# Patient Record
Sex: Male | Born: 1955 | Race: White | Hispanic: No | Smoking: Current every day smoker
Health system: Southern US, Community
[De-identification: ages and names within clinical notes are randomized; demographics above are authoritative.]

## PROBLEM LIST (undated history)

## (undated) DIAGNOSIS — E538 Deficiency of other specified B group vitamins: Secondary | ICD-10-CM

## (undated) DIAGNOSIS — L309 Dermatitis, unspecified: Secondary | ICD-10-CM

## (undated) DIAGNOSIS — D649 Anemia, unspecified: Secondary | ICD-10-CM

## (undated) DIAGNOSIS — I10 Essential (primary) hypertension: Secondary | ICD-10-CM

## (undated) DIAGNOSIS — K5190 Ulcerative colitis, unspecified, without complications: Secondary | ICD-10-CM

---

## 2004-08-14 ENCOUNTER — Inpatient Hospital Stay (HOSPITAL_COMMUNITY): Admission: EM | Admit: 2004-08-14 | Discharge: 2004-08-16 | Payer: Self-pay | Admitting: Emergency Medicine

## 2012-05-21 ENCOUNTER — Emergency Department (HOSPITAL_COMMUNITY): Payer: Medicare Other

## 2012-05-21 ENCOUNTER — Encounter (HOSPITAL_COMMUNITY): Payer: Self-pay | Admitting: *Deleted

## 2012-05-21 ENCOUNTER — Emergency Department (HOSPITAL_COMMUNITY)
Admission: EM | Admit: 2012-05-21 | Discharge: 2012-05-21 | Disposition: A | Discharge status: Home / Self Care | Payer: Medicare Other | Attending: Emergency Medicine | Admitting: Emergency Medicine

## 2012-05-21 DIAGNOSIS — L03221 Cellulitis of neck: Secondary | ICD-10-CM

## 2012-05-21 DIAGNOSIS — L0201 Cutaneous abscess of face: Secondary | ICD-10-CM | POA: Insufficient documentation

## 2012-05-21 DIAGNOSIS — R221 Localized swelling, mass and lump, neck: Secondary | ICD-10-CM | POA: Insufficient documentation

## 2012-05-21 DIAGNOSIS — R220 Localized swelling, mass and lump, head: Secondary | ICD-10-CM | POA: Insufficient documentation

## 2012-05-21 DIAGNOSIS — L03211 Cellulitis of face: Secondary | ICD-10-CM | POA: Insufficient documentation

## 2012-05-21 HISTORY — DX: Ulcerative colitis, unspecified, without complications: K51.90

## 2012-05-21 HISTORY — DX: Anemia, unspecified: D64.9

## 2012-05-21 LAB — BASIC METABOLIC PANEL
BUN: 6 mg/dL (ref 6–23)
CO2: 25 mEq/L (ref 19–32)
Calcium: 9.4 mg/dL (ref 8.4–10.5)
Chloride: 97 mEq/L (ref 96–112)
Creatinine, Ser: 0.85 mg/dL (ref 0.50–1.35)
GFR calc non Af Amer: >90 mL/min (ref >90)
Glucose, Bld: 96 mg/dL (ref 70–99)
Potassium: 3.8 mEq/L (ref 3.5–5.1)

## 2012-05-21 LAB — CBC WITH DIFFERENTIAL/PLATELET
Basophils Absolute: 0.1 10*3/uL (ref 0.0–0.1)
Basophils Relative: 0 % (ref 0–1)
Eosinophils Absolute: 0.1 10*3/uL (ref 0.0–0.7)
Eosinophils Relative: 1 % (ref 0–5)
HCT: 38.8 % — ABNORMAL LOW (ref 39.0–52.0)
Hemoglobin: 13 g/dL (ref 13.0–17.0)
Lymphocytes Relative: 5 % — ABNORMAL LOW (ref 12–46)
Lymphs Abs: 0.8 10*3/uL (ref 0.7–4.0)
MCH: 27.7 pg (ref 26.0–34.0)
MCV: 82.6 fL (ref 78.0–100.0)
Monocytes Relative: 13 % — ABNORMAL HIGH (ref 3–12)
Neutrophils Relative %: 81 % — ABNORMAL HIGH (ref 43–77)
RBC: 4.7 MIL/uL (ref 4.22–5.81)
RDW: 18.7 % — ABNORMAL HIGH (ref 11.5–15.5)

## 2012-05-21 MED ORDER — MELOXICAM 7.5 MG PO TABS: ORAL_TABLET | ORAL | Status: DC

## 2012-05-21 MED ORDER — CIPROFLOXACIN HCL 500 MG PO TABS: 500.0000 mg | ORAL_TABLET | Freq: Two times a day (BID) | ORAL | Status: DC

## 2012-05-21 MED ORDER — DEXTROSE 5 % IV SOLN
2.0000 g | Freq: Once | INTRAVENOUS | Status: AC
  Administered 2012-05-21: 2 g via INTRAVENOUS
  Filled 2012-05-21: qty 2

## 2012-05-21 MED ORDER — SODIUM CHLORIDE 0.9 % IV SOLN
Freq: Once | INTRAVENOUS | Status: AC
  Administered 2012-05-21: 13:00:00 via INTRAVENOUS

## 2012-05-21 MED ORDER — AMOXICILLIN-POT CLAVULANATE 875-125 MG PO TABS: 1.0000 | ORAL_TABLET | Freq: Two times a day (BID) | ORAL | Status: DC

## 2012-05-21 MED ORDER — OXYCODONE-ACETAMINOPHEN 5-325 MG PO TABS: ORAL_TABLET | ORAL | Status: DC

## 2012-05-21 MED ORDER — HYDROMORPHONE HCL PF 1 MG/ML IJ SOLN
0.5000 mg | Freq: Once | INTRAMUSCULAR | Status: AC
  Administered 2012-05-21: 0.5 mg via INTRAVENOUS
  Filled 2012-05-21: qty 1

## 2012-05-21 MED ORDER — CIPROFLOXACIN IN D5W 400 MG/200ML IV SOLN
400.0000 mg | Freq: Once | INTRAVENOUS | Status: AC
  Administered 2012-05-21: 400 mg via INTRAVENOUS
  Filled 2012-05-21: qty 200

## 2012-05-21 MED ORDER — OXYCODONE-ACETAMINOPHEN 5-325 MG PO TABS
1.0000 | ORAL_TABLET | Freq: Once | ORAL | Status: AC
  Administered 2012-05-21: 1 via ORAL
  Filled 2012-05-21: qty 1

## 2012-05-21 MED ORDER — ONDANSETRON HCL 4 MG/2ML IJ SOLN
4.0000 mg | Freq: Once | INTRAMUSCULAR | Status: AC
  Administered 2012-05-21: 4 mg via INTRAVENOUS
  Filled 2012-05-21: qty 2

## 2012-05-21 MED ORDER — IOHEXOL 300 MG/ML  SOLN
80.0000 mL | Freq: Once | INTRAMUSCULAR | Status: AC | PRN
  Administered 2012-05-21: 80 mL via INTRAVENOUS

## 2012-05-21 MED ORDER — KETOROLAC TROMETHAMINE 10 MG PO TABS
10.0000 mg | ORAL_TABLET | Freq: Once | ORAL | Status: AC
  Administered 2012-05-21: 10 mg via ORAL
  Filled 2012-05-21: qty 1

## 2012-05-21 NOTE — ED Provider Notes (Signed)
History     CSN: [id number]  Arrival date & time 05/21/12  1057   First MD Initiated Contact with Patient 05/21/12 1212      Chief Complaint  Patient presents with  . Facial Swelling    (Consider location/radiation/quality/duration/timing/severity/associated sxs/prior treatment) HPI Comments: Patient states that he has allergies and a lot of itching involving his scalp. He sometimes scratches to be called" draws blood" and even after the area scabs, he sometimes scratches the scab off. The patient states that he swims a lot in lakes or ponds and pools quite frequently. The patient presents to the emergency department today with 3 days of increased redness, and swelling with severe pain involving the left scalp the left year and a times the left neck. The patient denies fever or chills. States he has not been a well-defined comfortable position because of the pain. He has not had any drainage from the ear. He is at times had a problem hearing out of the ear, but states he frequently has sinus related issues as well. He denies any foreign bodies placed in the ear. He has tried Tylenol and Xanax but these are not helping.  The history is provided by the patient.    Past Medical History  Diagnosis Date  . Ulcerative colitis   . Anemia     History reviewed. No pertinent past surgical history.  No family history on file.  History  Substance Use Topics  . Smoking status: Current Every Day Smoker  . Smokeless tobacco: Not on file  . Alcohol Use: No      Review of Systems  Constitutional: Negative for activity change.       All ROS Neg except as noted in HPI  HENT: Positive for ear pain and neck pain. Negative for nosebleeds.   Eyes: Negative for photophobia and discharge.  Respiratory: Positive for wheezing. Negative for cough and shortness of breath.   Cardiovascular: Negative for chest pain and palpitations.  Gastrointestinal: Positive for abdominal pain. Negative for blood in  stool.  Genitourinary: Negative for dysuria, frequency and hematuria.  Musculoskeletal: Negative for back pain and arthralgias.  Skin: Negative.   Neurological: Negative for dizziness, seizures and speech difficulty.  Psychiatric/Behavioral: Negative for hallucinations and confusion. The patient is nervous/anxious.     Allergies  Review of patient's allergies indicates no known allergies.  Home Medications   Current Outpatient Rx  Name Route Sig Dispense Refill  . ACETAMINOPHEN 500 MG PO TABS Oral Take 1,000 mg by mouth every 6 (six) hours as needed. Pain    . ALPRAZOLAM 1 MG PO TABS Oral Take 1 mg by mouth 3 (three) times daily.    Marland Kitchen HYDROXYZINE HCL 25 MG PO TABS Oral Take 25 mg by mouth 3 (three) times daily as needed. Itching    . LOSARTAN POTASSIUM 100 MG PO TABS Oral Take 100 mg by mouth daily.    . ADULT MULTIVITAMIN W/MINERALS CH Oral Take 1 tablet by mouth daily.    . SULFASALAZINE 500 MG PO TABS Oral Take 1,000 mg by mouth 3 (three) times daily.      BP 143/89  Pulse 78  Temp 98.4 F (36.9 C)  Resp 16  Ht 5\' 10"  (1.778 m)  Wt 175 lb (79.379 kg)  BMI 25.11 kg/m2  SpO2 97%  Physical Exam  Nursing note and vitals reviewed. Constitutional: He is oriented to person, place, and time. He appears well-developed and well-nourished.  Non-toxic appearance.  HENT:  Head: Normocephalic.  Right Ear: Tympanic membrane and external ear normal.  Left Ear: Tympanic membrane and external ear normal.       There is increased redness and mild to moderate swelling of the left scalp. There is a scabbed lesion above the left year the scalp area. There is increased redness of the pinna of the left year. There is pain to movement of the left year. There is no drainage from the left ear. The tympanic membrane is well within normal limits. The bony landmarks identified.  Eyes: EOM and lids are normal. Pupils are equal, round, and reactive to light.  Neck: Neck supple. Carotid bruit is not  present.       There is soreness to palpation with a few palpable nodes on the left. There is pain with attempted range of motion of the neck. No rigidity appreciated. Trachea is in the midline.  Cardiovascular: Regular rhythm, normal heart sounds, intact distal pulses and normal pulses.  Tachycardia present.   Pulmonary/Chest: Breath sounds normal. No respiratory distress.  Abdominal: Soft. Bowel sounds are normal. There is no tenderness. There is no guarding.  Musculoskeletal: Normal range of motion.  Lymphadenopathy:       Head (right side): No submandibular adenopathy present.       Head (left side): No submandibular adenopathy present.    He has no cervical adenopathy.  Neurological: He is alert and oriented to person, place, and time. He has normal strength. No cranial nerve deficit or sensory deficit.  Skin: Skin is warm and dry.  Psychiatric: His speech is normal. His mood appears anxious.    ED Course  Procedures (including critical care time)  Labs Reviewed  CBC WITH DIFFERENTIAL - Abnormal; Notable for the following:    WBC 15.9 (*)     HCT 38.8 (*)     RDW 18.7 (*)     Neutrophils Relative 81 (*)     Neutro Abs 12.9 (*)     Lymphocytes Relative 5 (*)     Monocytes Relative 13 (*)     Monocytes Absolute 2.0 (*)     All other components within normal limits  BASIC METABOLIC PANEL - Abnormal; Notable for the following:    Sodium 134 (*)     All other components within normal limits   Ct Maxillofacial W/cm  05/21/2012  *RADIOLOGY REPORT*  Clinical Data: 56 year old male left facial swelling and left mastoid area pain.  CT MAXILLOFACIAL WITH CONTRAST  Technique:  Multidetector CT imaging of the maxillofacial structures was performed with intravenous contrast. Multiplanar CT image reconstructions were also generated.  Contrast: 80mL OMNIPAQUE IOHEXOL 300 MG/ML  SOLN  Comparison: cervical spine CT 08/14/2004.  Findings: Visualized brain parenchyma is within normal limits.  Visualized orbit soft tissues are within normal limits.  The paranasal sinuses are clear except for inferior right maxillary sinus mucous retention cyst.  The right tympanic cavity and mastoids are clear.  The visualized deep soft tissue spaces of the face are within normal limits.  The left tympanic cavity and mastoids are clear.  There is stranding of the superficial soft tissues and skin thickening which begins in the left superior scalp convexity and continues caudally through the periauricular area, adjacent to the left parotid gland and along the left sternocleidomastoid muscle.  By the level of the thyroid cartilage, the inflammatory changes abate. There is associated diffuse soft tissue swelling of the left pinna.  There is  heterogeneous enhancement of the left sternocleidomastoid muscle.  Left parotid gland  enhancement appears more normal  There are asymmetric left posterior lymph nodes, increased in number but nonpathologically enlarged.  .  Visualized major vascular structures are patent including the left external jugular vein and the nondominant left internal jugular vein.  No organized or drainable fluid or fluid collection identified.  Negative visualized upper cervical spine except for degenerative changes which are greater on the left.  IMPRESSION: 1.  Left lateral scalp, face, and upper neck inflammation compatible with acute infectious cellulitis/myositis.  Involvement of the left sternocleidomastoid muscle, but no middle ear/mastoid involvement.  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 Recommend prompt institution of appropriate antibiotic therapy. 2.  Reactive left cervical lymph nodes. No abscess or drainable fluid collection.  These results will be called to the ordering clinician or representative by the Radiologist Assistant, and communication documented in the PACS Dashboard.   Original Report Authenticated By:  Harley Hallmark, M.D.      1. Cellulitis of multiple sites of scalp and neck       MDM  I have reviewed nursing notes, vital signs, and all appropriate lab and imaging results for this patient. Patient has a lot of problems with itching in the scalp, he states that he may have scratched a sore in his scalp he now has a scab in the left scalp area. He also swims in lakes and ponds frequently. The patient had a CT scan with contrast of the maxillofacial area which revealed left lateral scalp face and upper neck inflammation compatible with an acute infectious process of cellulitis or myositis. There is involvement of the left sternocleidomastoid muscle but no middle ear, mass or mastoid involvement. In light of the diffuse left pinna swelling a Pseudomonas infection was favored. The basic metabolic panel shows the sodium to be slightly low at 134 otherwise well within normal limits. The complete blood count showed a white blood cell count elevated at 15,900, the hemoglobin is slightly low at 38.8 otherwise within normal limits. The differential shows the neutrophils to be slightly elevated at 81 otherwise essentially within normal limits. The patient was seen with me by Dr. Preston Fleeting. The case was discussed with the ear nose and throat specialist Dr. Suszanne Conners. He is in agreement with the IV Cipro that was given in the emergency department, and suggests oral Cipro for the next 14 days along with Augmentin. He will see the patient in his office on Thursday (October 10) unless complications arise before then. Prescription for Percocet and Mobic also given.       Kathie Dike, [place] 05/21/12 [phone]

## 2012-05-21 NOTE — ED Notes (Signed)
Patient ambulatory to restroom with steady gait.

## 2012-05-21 NOTE — ED Provider Notes (Addendum)
56 year old male has had progressive swelling and pain in the left side of his scalp and around his ear. On exam, there is edema of the skin erythema and tenderness which extends from the preauricular area to the postauricular area and into the neck. There does not seem to be any vomit the external auditory canal, but parts of the external ear are infected. CT is worrisome for malignant otitis externa, but he clinically does not appear to have that. He will be started on IV antibiotics and will need to be admitted for further IV antibiotics and consideration for evaluation by ENT.he does not appear toxic at this time, and I feel that he can safely be admitted to [facility].   Dione Booze, MD 05/21/12 1458  Medical screening examination/treatment/procedure(s) were conducted as a shared visit with non-physician practitioner(s) and myself.  I personally evaluated the patient during the encounter   Dione Booze, MD 05/21/12 1459

## 2012-05-21 NOTE — ED Notes (Signed)
Patient with no complaints at this time. Respirations even and unlabored. Skin warm/dry. Discharge instructions reviewed with patient at this time. Patient given opportunity to voice concerns/ask questions. IV removed per policy and band-aid applied to site. Patient discharged at this time and left Emergency Department with steady gait.  

## 2012-05-21 NOTE — ED Notes (Signed)
Redness and swelling with severe pain to left scalp/face. Ear pain

## 2012-05-22 ENCOUNTER — Ambulatory Visit (INDEPENDENT_AMBULATORY_CARE_PROVIDER_SITE_OTHER): Payer: Medicare Other | Admitting: Otolaryngology

## 2012-05-22 DIAGNOSIS — L0201 Cutaneous abscess of face: Secondary | ICD-10-CM

## 2012-05-29 ENCOUNTER — Ambulatory Visit (INDEPENDENT_AMBULATORY_CARE_PROVIDER_SITE_OTHER): Payer: Medicare Other | Admitting: Otolaryngology

## 2014-11-10 ENCOUNTER — Emergency Department (HOSPITAL_COMMUNITY): Payer: Medicare HMO

## 2014-11-10 ENCOUNTER — Emergency Department (HOSPITAL_COMMUNITY)
Admission: EM | Admit: 2014-11-10 | Discharge: 2014-11-10 | Disposition: A | Discharge status: Home / Self Care | Payer: Medicare HMO | Attending: Emergency Medicine | Admitting: Emergency Medicine

## 2014-11-10 ENCOUNTER — Encounter (HOSPITAL_COMMUNITY): Payer: Self-pay | Admitting: *Deleted

## 2014-11-10 DIAGNOSIS — E876 Hypokalemia: Secondary | ICD-10-CM | POA: Insufficient documentation

## 2014-11-10 DIAGNOSIS — Z7952 Long term (current) use of systemic steroids: Secondary | ICD-10-CM | POA: Diagnosis not present

## 2014-11-10 DIAGNOSIS — Z720 Tobacco use: Secondary | ICD-10-CM | POA: Diagnosis not present

## 2014-11-10 DIAGNOSIS — R109 Unspecified abdominal pain: Secondary | ICD-10-CM

## 2014-11-10 DIAGNOSIS — Z872 Personal history of diseases of the skin and subcutaneous tissue: Secondary | ICD-10-CM | POA: Diagnosis not present

## 2014-11-10 DIAGNOSIS — Z862 Personal history of diseases of the blood and blood-forming organs and certain disorders involving the immune mechanism: Secondary | ICD-10-CM | POA: Insufficient documentation

## 2014-11-10 DIAGNOSIS — Z792 Long term (current) use of antibiotics: Secondary | ICD-10-CM | POA: Insufficient documentation

## 2014-11-10 DIAGNOSIS — R1033 Periumbilical pain: Secondary | ICD-10-CM | POA: Diagnosis present

## 2014-11-10 DIAGNOSIS — I10 Essential (primary) hypertension: Secondary | ICD-10-CM | POA: Diagnosis not present

## 2014-11-10 DIAGNOSIS — Z79899 Other long term (current) drug therapy: Secondary | ICD-10-CM | POA: Diagnosis not present

## 2014-11-10 DIAGNOSIS — K529 Noninfective gastroenteritis and colitis, unspecified: Secondary | ICD-10-CM | POA: Diagnosis not present

## 2014-11-10 HISTORY — DX: Dermatitis, unspecified: L30.9

## 2014-11-10 HISTORY — DX: Deficiency of other specified B group vitamins: E53.8

## 2014-11-10 HISTORY — DX: Essential (primary) hypertension: I10

## 2014-11-10 LAB — COMPREHENSIVE METABOLIC PANEL
ALBUMIN: 4.3 g/dL (ref 3.5–5.2)
ALT: 36 U/L (ref 0–53)
AST: 47 U/L — ABNORMAL HIGH (ref 0–37)
Alkaline Phosphatase: 74 U/L (ref 39–117)
Anion gap: 9 (ref 5–15)
BUN: 6 mg/dL (ref 6–23)
CO2: 27 mmol/L (ref 19–32)
Calcium: 9.2 mg/dL (ref 8.4–10.5)
Chloride: 99 mmol/L (ref 96–112)
Creatinine, Ser: 0.84 mg/dL (ref 0.50–1.35)
GFR calc Af Amer: >90 mL/min (ref >90)
GFR calc non Af Amer: >90 mL/min (ref >90)
Glucose, Bld: 110 mg/dL — ABNORMAL HIGH (ref 70–99)
Potassium: 3.2 mmol/L — ABNORMAL LOW (ref 3.5–5.1)
Sodium: 135 mmol/L (ref 135–145)
TOTAL PROTEIN: 7.7 g/dL (ref 6.0–8.3)
Total Bilirubin: 0.5 mg/dL (ref 0.3–1.2)

## 2014-11-10 LAB — CBC WITH DIFFERENTIAL/PLATELET
Basophils Absolute: 0.1 10*3/uL (ref 0.0–0.1)
Basophils Relative: 0 % (ref 0–1)
EOS ABS: 0.2 10*3/uL (ref 0.0–0.7)
Eosinophils Relative: 1 % (ref 0–5)
HCT: 43.5 % (ref 39.0–52.0)
Hemoglobin: 14.8 g/dL (ref 13.0–17.0)
Lymphocytes Relative: 7 % — ABNORMAL LOW (ref 12–46)
Lymphs Abs: 0.9 10*3/uL (ref 0.7–4.0)
MCH: 31.8 pg (ref 26.0–34.0)
MCHC: 34 g/dL (ref 30.0–36.0)
MCV: 93.3 fL (ref 78.0–100.0)
Monocytes Absolute: 1.2 10*3/uL — ABNORMAL HIGH (ref 0.1–1.0)
Monocytes Relative: 10 % (ref 3–12)
Neutro Abs: 10 10*3/uL — ABNORMAL HIGH (ref 1.7–7.7)
Neutrophils Relative %: 82 % — ABNORMAL HIGH (ref 43–77)
Platelets: 279 10*3/uL (ref 150–400)
RBC: 4.66 MIL/uL (ref 4.22–5.81)
RDW: 14.5 % (ref 11.5–15.5)
WBC: 12.3 10*3/uL — ABNORMAL HIGH (ref 4.0–10.5)

## 2014-11-10 LAB — URINALYSIS, ROUTINE W REFLEX MICROSCOPIC
BILIRUBIN URINE: NEGATIVE
GLUCOSE, UA: NEGATIVE mg/dL
HGB URINE DIPSTICK: NEGATIVE
KETONES UR: 15 mg/dL — AB
LEUKOCYTES UA: NEGATIVE
Nitrite: NEGATIVE
PH: 8 (ref 5.0–8.0)
Protein, ur: NEGATIVE mg/dL
Specific Gravity, Urine: 1.01 (ref 1.005–1.030)
Urobilinogen, UA: 0.2 mg/dL (ref 0.0–1.0)

## 2014-11-10 LAB — LIPASE, BLOOD: Lipase: 17 U/L (ref 11–59)

## 2014-11-10 MED ORDER — PROMETHAZINE HCL 25 MG PO TABS: 25.0000 mg | ORAL_TABLET | Freq: Four times a day (QID) | ORAL | Status: DC | PRN

## 2014-11-10 MED ORDER — IOHEXOL 300 MG/ML  SOLN
50.0000 mL | Freq: Once | INTRAMUSCULAR | Status: AC | PRN
  Administered 2014-11-10: 50 mL via ORAL

## 2014-11-10 MED ORDER — HYDROCODONE-ACETAMINOPHEN 7.5-325 MG PO TABS: 1.0000 | ORAL_TABLET | Freq: Four times a day (QID) | ORAL | Status: DC | PRN

## 2014-11-10 MED ORDER — PREDNISONE 5 MG PO TABS: 15.0000 mg | ORAL_TABLET | Freq: Two times a day (BID) | ORAL | Status: DC

## 2014-11-10 MED ORDER — PREDNISONE 20 MG PO TABS
ORAL_TABLET | ORAL | Status: AC
  Filled 2014-11-10: qty 1

## 2014-11-10 MED ORDER — MORPHINE SULFATE 4 MG/ML IJ SOLN
4.0000 mg | Freq: Once | INTRAMUSCULAR | Status: AC
  Administered 2014-11-10: 4 mg via INTRAVENOUS
  Filled 2014-11-10: qty 1

## 2014-11-10 MED ORDER — PREDNISONE 20 MG PO TABS
30.0000 mg | ORAL_TABLET | Freq: Once | ORAL | Status: AC
  Administered 2014-11-10: 30 mg via ORAL

## 2014-11-10 MED ORDER — POTASSIUM CHLORIDE 20 MEQ PO PACK
40.0000 meq | PACK | Freq: Once | ORAL | Status: AC
  Administered 2014-11-10: 40 meq via ORAL
  Filled 2014-11-10: qty 2

## 2014-11-10 MED ORDER — PREDNISONE 10 MG PO TABS
15.0000 mg | ORAL_TABLET | Freq: Once | ORAL | Status: DC
  Filled 2014-11-10 (×2): qty 2

## 2014-11-10 MED ORDER — ONDANSETRON HCL 4 MG/2ML IJ SOLN
4.0000 mg | Freq: Once | INTRAMUSCULAR | Status: AC
  Administered 2014-11-10: 4 mg via INTRAVENOUS
  Filled 2014-11-10: qty 2

## 2014-11-10 MED ORDER — IOHEXOL 300 MG/ML  SOLN
100.0000 mL | Freq: Once | INTRAMUSCULAR | Status: AC | PRN
  Administered 2014-11-10: 100 mL via INTRAVENOUS

## 2014-11-10 MED ORDER — POTASSIUM CHLORIDE 20 MEQ PO PACK
PACK | ORAL | Status: AC
  Filled 2014-11-10: qty 1

## 2014-11-10 MED ORDER — HYDROMORPHONE HCL 1 MG/ML IJ SOLN
1.0000 mg | Freq: Once | INTRAMUSCULAR | Status: AC
  Administered 2014-11-10: 1 mg via INTRAVENOUS
  Filled 2014-11-10: qty 1

## 2014-11-10 NOTE — ED Provider Notes (Signed)
CSN: [id number]     Arrival date & time 11/10/14  9562 History   First MD Initiated Contact with Patient 11/10/14 [phone]     Chief Complaint  Patient presents with  . Abdominal Pain     (Consider location/radiation/quality/duration/timing/severity/associated sxs/prior Treatment) The history is provided by the patient and the spouse.   Adrian Edwards is a 59 y.o. male presenting with periumbilical abdominal pain which is waxing and waning in character, sharp, worsened when supine and better when upright, starting 3 days ago.  He has history of ulcerative colitis and is concerned he may be having a flare, although his symptoms are not entirely similar to prior flareups.  He denies bleeding with bowel movements, no mucus.  He was having diarrhea until Sunday but has had no bowel movement since that time.  He does endorse nausea without emesis, abdominal distention.  He is currently taking oxycodone for right foot pain which she suspects may be contributing to constipation.  He reports chills without documented fevers.  Has had no dysuria or increased urinary frequency.  He has had poor appetite, but has been maintaining fluid intake.  He does have generalized weakness however and feels dehydrated.  No headaches or dizziness with positional changes.  Also denies chest pain and shortness of breath.     Past Medical History  Diagnosis Date  . Ulcerative colitis   . Anemia   . Hypertension   . B12 deficiency   . Dermatitis    History reviewed. No pertinent past surgical history. No family history on file. History  Substance Use Topics  . Smoking status: Current Every Day Smoker  . Smokeless tobacco: Not on file  . Alcohol Use: No    Review of Systems  Constitutional: Positive for chills and appetite change. Negative for fever.  HENT: Negative for congestion and sore throat.   Eyes: Negative.   Respiratory: Negative for chest tightness and shortness of breath.   Cardiovascular: Negative  for chest pain.  Gastrointestinal: Positive for nausea, abdominal pain, constipation and abdominal distention. Negative for vomiting and blood in stool.  Genitourinary: Negative.  Negative for dysuria.  Musculoskeletal: Negative for joint swelling, arthralgias and neck pain.  Skin: Negative.  Negative for rash and wound.  Neurological: Positive for weakness. Negative for dizziness, light-headedness, numbness and headaches.  Psychiatric/Behavioral: Negative.       Allergies  Aspirin and Ketorolac tromethamine  Home Medications   Prior to Admission medications   Medication Sig Start Date End Date Taking? Authorizing Provider  acetaminophen (TYLENOL) 500 MG tablet Take 1,000 mg by mouth every 6 (six) hours as needed. Pain   Yes Historical Provider, MD  ALPRAZolam Prudy Feeler) 1 MG tablet Take 1 mg by mouth every 6 (six) hours as needed for anxiety.    Yes Historical Provider, MD  fluticasone (FLONASE) 50 MCG/ACT nasal spray Place 1 spray into both nostrils daily.   Yes Historical Provider, MD  HYDROcodone-acetaminophen (NORCO) 7.5-325 MG per tablet Take 1 tablet by mouth every 6 (six) hours as needed. 10/29/14  Yes Historical Provider, MD  hydrOXYzine (ATARAX/VISTARIL) 25 MG tablet Take 25 mg by mouth 3 (three) times daily as needed. Itching   Yes Historical Provider, MD  Multiple Vitamin (MULTIVITAMIN WITH MINERALS) TABS Take 1 tablet by mouth daily.   Yes Historical Provider, MD  sulfaSALAzine (AZULFIDINE) 500 MG tablet Take 1,000 mg by mouth 3 (three) times daily.   Yes Historical Provider, MD  telmisartan (MICARDIS) 80 MG tablet Take 1 tablet by  mouth daily. 11/01/14  Yes Historical Provider, MD  triamcinolone cream (KENALOG) 0.1 % Apply 1 application topically 2 (two) times daily. 10/20/14  Yes Historical Provider, MD  amoxicillin-clavulanate (AUGMENTIN) 875-125 MG per tablet Take 1 tablet by mouth every 12 (twelve) hours. Patient not taking: Reported on 11/10/2014 05/21/12   Ivery Quale, PA-C   ciprofloxacin (CIPRO) 500 MG tablet Take 1 tablet (500 mg total) by mouth every 12 (twelve) hours. Patient not taking: Reported on 11/10/2014 05/21/12   Ivery Quale, PA-C  meloxicam (MOBIC) 7.5 MG tablet 1 po bid with food Patient not taking: Reported on 11/10/2014 05/21/12   Ivery Quale, PA-C  oxyCODONE-acetaminophen (PERCOCET/ROXICET) 5-325 MG per tablet 1 or 2 po q4h prn pain Patient not taking: Reported on 11/10/2014 05/21/12   Ivery Quale, PA-C  predniSONE (DELTASONE) 5 MG tablet Take 3 tablets (15 mg total) by mouth 2 (two) times daily with a meal. 11/10/14   Burgess Amor, PA-C  promethazine (PHENERGAN) 25 MG tablet Take 1 tablet (25 mg total) by mouth every 6 (six) hours as needed for nausea or vomiting. 11/10/14   Burgess Amor, PA-C   BP 161/89 mmHg  Pulse 74  Temp(Src) 98.1 F (36.7 C) (Oral)  Resp 18  Ht  (1.778 m)  Wt 180 lb (81.647 kg)  BMI 25.83 kg/m2  SpO2 94% Physical Exam  Constitutional: He is oriented to person, place, and time. He appears well-developed and well-nourished.  HENT:  Head: Normocephalic and atraumatic.  Eyes: Conjunctivae are normal.  Neck: Normal range of motion.  Cardiovascular: Normal rate, regular rhythm, normal heart sounds and intact distal pulses.   Pulmonary/Chest: Effort normal and breath sounds normal. He has no wheezes. He exhibits no tenderness.  Abdominal: Soft. Bowel sounds are normal. He exhibits distension. There is tenderness.  Generalized abdominal tenderness without guarding or rebound.  He has reduced bowel sounds throughout.  No increased tympany with percussion.  Musculoskeletal: Normal range of motion.  Neurological: He is alert and oriented to person, place, and time.  Skin: Skin is warm and dry.  Psychiatric: He has a normal mood and affect.  Nursing note and vitals reviewed.   ED Course  Procedures (including critical care time) Labs Review Labs Reviewed  CBC WITH DIFFERENTIAL/PLATELET - Abnormal; Notable for the  following:    WBC 12.3 (*)    Neutrophils Relative % 82 (*)    Neutro Abs 10.0 (*)    Lymphocytes Relative 7 (*)    Monocytes Absolute 1.2 (*)    All other components within normal limits  COMPREHENSIVE METABOLIC PANEL - Abnormal; Notable for the following:    Potassium 3.2 (*)    Glucose, Bld 110 (*)    AST 47 (*)    All other components within normal limits  URINALYSIS, ROUTINE W REFLEX MICROSCOPIC - Abnormal; Notable for the following:    Ketones, ur 15 (*)    All other components within normal limits  LIPASE, BLOOD    Imaging Review Ct Abdomen Pelvis W Contrast  11/10/2014   CLINICAL DATA:  Three-day history of worsening abdominal pain. History of ulcerative colitis  EXAM: CT ABDOMEN AND PELVIS WITH CONTRAST  TECHNIQUE: Multidetector CT imaging of the abdomen and pelvis was performed using the standard protocol following bolus administration of intravenous contrast. Oral contrast was also administered.  CONTRAST:  OMNIPAQUE IOHEXOL 300 MG/ML  SOLN  COMPARISON:  August 14, 2004  FINDINGS: There is scarring in both posterior lung bases. There is no lung base  edema or consolidation.  No focal liver lesions are identified. Gallbladder wall is not appreciably thickened. There is no biliary duct dilatation.  Spleen, pancreas, and adrenals appear normal.  There is a cyst in the lower pole of the right kidney measuring 1.4 x 1.3 cm. There is a cyst in the anterior mid left kidney measuring 0.9 x 0.7 cm. There is no hydronephrosis on either side. There is no renal or ureteral calculus on either side.  In the pelvis, the urinary bladder is midline with normal wall thickness. There is fat in each inguinal ring. There is no pelvic mass or pelvic fluid collection. There is no appreciable rectal wall thickening. There is some slight wall thickening in the proximal sigmoid colon without surrounding inflammation. No evidence of perforation. Appendix appears normal. Terminal ileum appears normal.  There is lipomatous infiltration in the ileocecal valve.  There is no bowel obstruction. No free air or portal venous air. There is no appreciable ascites, adenopathy, or abscess in the abdomen or pelvis. There is atherosclerotic change in the aorta but no appreciable aneurysm. There is degenerative change in the lumbar spine. There are no blastic or lytic bone lesions.  IMPRESSION: There is a loop of proximal sigmoid colon with mild wall thickening but no surrounding mesenteric inflammation. A short segment of colitis is questioned in this area. No other appreciable bowel wall thickening is noted. Terminal ileum appears normal. There is lipomatous infiltration of the ileocecal valve. Appendix appears normal.  No bowel obstruction.  No abscess.  No renal or ureteral calculus.  No hydronephrosis.   Electronically Signed   By: Bretta BangWilliam  Woodruff III M.D.   On: 11/10/2014 14:15   Dg Abd 2 Views  11/10/2014   CLINICAL DATA:  Umbilical abdominal pain. History of ulcerative colitis.  EXAM: ABDOMEN - 2 VIEW  COMPARISON:  None.  FINDINGS: Gas within nondistended large and small bowel. No obstruction or free air. No organomegaly or suspicious calcification. Visualized lung bases are clear. Degenerative changes in the lumbar spine and hips.  IMPRESSION: No evidence of bowel obstruction or free air.   Electronically Signed   By: Charlett NoseKevin  Dover M.D.   On: 11/10/2014 10:04     EKG Interpretation None      MDM   Final diagnoses:  Hypokalemia  Colitis    Patients labs and/or radiological studies were reviewed and considered during the medical decision making and disposition process.  Results were also discussed with patient.  Discussed case with Dr. Gwenevere AbbotMixon [facility], on call for pt's primary GI specialist Dr. Noelle PennerGibbs - [phone].  Advised prednisone 15 mg bid until recheck in their office next week.  They will contact pt.  Also, advised recheck sooner for any worsened sx.     Burgess AmorJulie Lakecia Deschamps, PA-C 11/10/14  1619  Margarita Grizzleanielle Ray, MD 11/11/14 [phone]

## 2014-11-10 NOTE — ED Notes (Signed)
Pt states he has history of ulcerative collitis. His pain began on Sunday and has persisted since. Pt states he has been having nausea with vomiting.  NAD noted, pt is anxious on assessment.

## 2014-11-10 NOTE — Discharge Instructions (Signed)

## 2016-10-20 ENCOUNTER — Emergency Department (HOSPITAL_COMMUNITY)
Admission: EM | Admit: 2016-10-20 | Discharge: 2016-10-20 | Disposition: A | Discharge status: Home / Self Care | Payer: Medicare HMO | Attending: Emergency Medicine | Admitting: Emergency Medicine

## 2016-10-20 ENCOUNTER — Encounter (HOSPITAL_COMMUNITY): Payer: Self-pay | Admitting: *Deleted

## 2016-10-20 ENCOUNTER — Emergency Department (HOSPITAL_COMMUNITY): Payer: Medicare HMO

## 2016-10-20 DIAGNOSIS — Y998 Other external cause status: Secondary | ICD-10-CM | POA: Insufficient documentation

## 2016-10-20 DIAGNOSIS — R109 Unspecified abdominal pain: Secondary | ICD-10-CM

## 2016-10-20 DIAGNOSIS — S46911A Strain of unspecified muscle, fascia and tendon at shoulder and upper arm level, right arm, initial encounter: Secondary | ICD-10-CM | POA: Insufficient documentation

## 2016-10-20 DIAGNOSIS — I10 Essential (primary) hypertension: Secondary | ICD-10-CM | POA: Insufficient documentation

## 2016-10-20 DIAGNOSIS — R1033 Periumbilical pain: Secondary | ICD-10-CM | POA: Insufficient documentation

## 2016-10-20 DIAGNOSIS — Z79899 Other long term (current) drug therapy: Secondary | ICD-10-CM | POA: Insufficient documentation

## 2016-10-20 DIAGNOSIS — Y929 Unspecified place or not applicable: Secondary | ICD-10-CM | POA: Diagnosis not present

## 2016-10-20 DIAGNOSIS — S8001XA Contusion of right knee, initial encounter: Secondary | ICD-10-CM | POA: Insufficient documentation

## 2016-10-20 DIAGNOSIS — W19XXXA Unspecified fall, initial encounter: Secondary | ICD-10-CM

## 2016-10-20 DIAGNOSIS — M25461 Effusion, right knee: Secondary | ICD-10-CM | POA: Diagnosis not present

## 2016-10-20 DIAGNOSIS — W11XXXA Fall on and from ladder, initial encounter: Secondary | ICD-10-CM | POA: Diagnosis not present

## 2016-10-20 DIAGNOSIS — S4991XA Unspecified injury of right shoulder and upper arm, initial encounter: Secondary | ICD-10-CM | POA: Diagnosis present

## 2016-10-20 DIAGNOSIS — S7012XA Contusion of left thigh, initial encounter: Secondary | ICD-10-CM | POA: Diagnosis not present

## 2016-10-20 DIAGNOSIS — F17210 Nicotine dependence, cigarettes, uncomplicated: Secondary | ICD-10-CM | POA: Insufficient documentation

## 2016-10-20 DIAGNOSIS — Y9339 Activity, other involving climbing, rappelling and jumping off: Secondary | ICD-10-CM | POA: Insufficient documentation

## 2016-10-20 LAB — CBC WITH DIFFERENTIAL/PLATELET
Basophils Absolute: 0.1 10*3/uL (ref 0.0–0.1)
Basophils Relative: 1 %
Eosinophils Absolute: 0.1 10*3/uL (ref 0.0–0.7)
Eosinophils Relative: 1 %
HEMATOCRIT: 41.7 % (ref 39.0–52.0)
Hemoglobin: 14.4 g/dL (ref 13.0–17.0)
LYMPHS PCT: 13 %
Lymphs Abs: 1.1 10*3/uL (ref 0.7–4.0)
MCH: 32.3 pg (ref 26.0–34.0)
MCHC: 34.5 g/dL (ref 30.0–36.0)
MCV: 93.5 fL (ref 78.0–100.0)
MONO ABS: 1.1 10*3/uL — AB (ref 0.1–1.0)
Monocytes Relative: 12 %
NEUTROS ABS: 6.5 10*3/uL (ref 1.7–7.7)
Neutrophils Relative %: 73 %
Platelets: 240 10*3/uL (ref 150–400)
RBC: 4.46 MIL/uL (ref 4.22–5.81)
RDW: 14.2 % (ref 11.5–15.5)
WBC: 8.8 10*3/uL (ref 4.0–10.5)

## 2016-10-20 LAB — COMPREHENSIVE METABOLIC PANEL
ALK PHOS: 57 U/L (ref 38–126)
ALT: 26 U/L (ref 17–63)
AST: 38 U/L (ref 15–41)
Albumin: 4 g/dL (ref 3.5–5.0)
Anion gap: 9 (ref 5–15)
BUN: 7 mg/dL (ref 6–20)
CO2: 25 mmol/L (ref 22–32)
CREATININE: 0.77 mg/dL (ref 0.61–1.24)
Calcium: 8.9 mg/dL (ref 8.9–10.3)
Chloride: 100 mmol/L — ABNORMAL LOW (ref 101–111)
GFR calc Af Amer: >60 mL/min (ref >60)
Glucose, Bld: 107 mg/dL — ABNORMAL HIGH (ref 65–99)
Potassium: 2.8 mmol/L — ABNORMAL LOW (ref 3.5–5.1)
Sodium: 134 mmol/L — ABNORMAL LOW (ref 135–145)
Total Bilirubin: 0.5 mg/dL (ref 0.3–1.2)
Total Protein: 7.2 g/dL (ref 6.5–8.1)

## 2016-10-20 LAB — LIPASE, BLOOD: Lipase: 10 U/L — ABNORMAL LOW (ref 11–51)

## 2016-10-20 MED ORDER — PREDNISONE 20 MG PO TABS: ORAL_TABLET | ORAL | 0 refills | Status: DC

## 2016-10-20 MED ORDER — HYDROCODONE-ACETAMINOPHEN 5-325 MG PO TABS
2.0000 | ORAL_TABLET | Freq: Once | ORAL | Status: AC
  Administered 2016-10-20: 2 via ORAL
  Filled 2016-10-20: qty 2

## 2016-10-20 MED ORDER — POTASSIUM CHLORIDE CRYS ER 20 MEQ PO TBCR: 20.0000 meq | EXTENDED_RELEASE_TABLET | Freq: Every day | ORAL | 0 refills | Status: DC

## 2016-10-20 MED ORDER — POTASSIUM CHLORIDE 20 MEQ PO PACK
40.0000 meq | PACK | Freq: Once | ORAL | Status: AC
  Administered 2016-10-20: 40 meq via ORAL
  Filled 2016-10-20: qty 2

## 2016-10-20 NOTE — ED Provider Notes (Signed)
[facility]-EMERGENCY DEPT Provider Note   CSN: [id number] Arrival date & time: 10/20/16  1478  By signing my name below, I, Adrian Edwards, attest that this documentation has been prepared under the direction and in the presence of Blane Ohara, MD. Electronically Signed: Rosario Edwards, ED Scribe. 10/20/16. 9:52 AM.  History   Chief Complaint Chief Complaint  Patient presents with  . Fall  . Abdominal Pain   The history is provided by the patient. No language interpreter was used.    HPI Comments: Adrian Edwards is a 61 y.o. male with a h/o ulcerative colitis, HTN, and anemia, who presents to the Emergency Department complaining of several areas of pain and ecchymosis to the bilateral lower extremities and to the right shoulder s/p ~5 foot high fall which occurred three days ago. Per pt, he was climbing a ladder three days ago when he lost his footing and fell off, causing him to land onto his right buttock region and onto the right shoulder. No LOC or head injury. He sustained several bruises and areas of pain to his bilateral lower extremities, all of which have been improving. He additionally states that his right shoulder will feel subjectively numb at times since his fall, however, he denies any weakness or numbness otherwise. Pt is not currently on anticoagulant or antiplatelet therapy.   Secondarily, he is c/o gradually worsening, diffuse periumbilical and lower abdominal pain beginning two days ago following his fall. Pt has a h/o ulcerative colitis and states that his symptoms feel similar to this. He notes associated nausea, subjective fever, and chills secondary to the onset of his abdominal pain. No treatments for his pain were tried prior to coming into the ED. No PSHx to the abdomen. No frequent alcohol usage. He denies diarrhea, hematochezia, neck pain, vomiting, or any other associated symptoms.   Past Medical History:  Diagnosis Date  . Anemia   . B12 deficiency   .  Dermatitis   . Hypertension   . Ulcerative colitis (HCC)    There are no active problems to display for this patient.  History reviewed. No pertinent surgical history.  Home Medications    Prior to Admission medications   Medication Sig Start Date End Date Taking? Authorizing Provider  acetaminophen (TYLENOL) 500 MG tablet Take 1,000 mg by mouth every 6 (six) hours as needed. Pain   Yes Historical Provider, MD  ALPRAZolam Prudy Feeler) 1 MG tablet Take 1 mg by mouth 4 (four) times daily.    Yes Historical Provider, MD  escitalopram (LEXAPRO) 10 MG tablet Take 10 mg by mouth daily.   Yes Historical Provider, MD  fluticasone (FLONASE) 50 MCG/ACT nasal spray Place 1 spray into both nostrils daily.   Yes Historical Provider, MD  HYDROcodone-acetaminophen (NORCO) 7.5-325 MG per tablet Take 1 tablet by mouth every 6 (six) hours as needed for moderate pain. 11/10/14  Yes Raynelle Fanning Idol, PA-C  hydrOXYzine (ATARAX/VISTARIL) 25 MG tablet Take 25 mg by mouth 3 (three) times daily as needed. Itching   Yes Historical Provider, MD  Multiple Vitamin (MULTIVITAMIN WITH MINERALS) TABS Take 1 tablet by mouth daily.   Yes Historical Provider, MD  sulfaSALAzine (AZULFIDINE) 500 MG tablet Take 1,000 mg by mouth 3 (three) times daily.   Yes Historical Provider, MD  telmisartan (MICARDIS) 80 MG tablet Take 1 tablet by mouth daily. 11/01/14  Yes Historical Provider, MD  triamcinolone cream (KENALOG) 0.1 % Apply 1 application topically 2 (two) times daily. 10/20/14  Yes Historical Provider, MD  potassium chloride SA (K-DUR,KLOR-CON) 20 MEQ tablet Take 1 tablet (20 mEq total) by mouth daily. 10/20/16   Blane Ohara, MD  predniSONE (DELTASONE) 20 MG tablet 2 tabs po daily x 4 days 10/20/16   Blane Ohara, MD   Family History No family history on file.  Social History Social History  Substance Use Topics  . Smoking status: Current Every Day Smoker    Packs/day: 0.50    Types: Cigarettes  . Smokeless tobacco: Never Used  .  Alcohol use No   Allergies   Aspirin and Ketorolac tromethamine  Review of Systems Review of Systems  Constitutional: Positive for chills and fever.  Gastrointestinal: Positive for abdominal pain and nausea. Negative for blood in stool, diarrhea and vomiting.  Musculoskeletal: Positive for arthralgias and myalgias. Negative for neck pain.  Skin: Positive for color change ( +bruising).  Neurological: Positive for numbness (R shoulder). Negative for syncope and weakness.  All other systems reviewed and are negative.  Physical Exam Updated Vital Signs BP 178/90   Pulse 71   Temp 97.8 F (36.6 C) (Oral)   Resp 20   Ht 5\' 9"  (1.753 m)   Wt 175 lb (79.4 kg)   SpO2 100%   BMI 25.84 kg/m   Physical Exam  Constitutional: He appears well-developed and well-nourished. No distress.  HENT:  Head: Normocephalic and atraumatic.  Eyes: Conjunctivae and EOM are normal.  Neck: Normal range of motion.  No midline neck tenderness.   Cardiovascular: Normal rate.   Pulmonary/Chest: Effort normal.  Abdominal: Soft. He exhibits no distension.  Abdomen is soft, no guarding, discomfort is noted to the periumbilical region.   Musculoskeletal: Normal range of motion.  Discomfort to the superior aspect of the right shoulder, mild anterior proximal humerus tenderness. No obvious deformity. No effusion. No significant pain to the b/l elbow or wrists.   There is ecchymosis approximately 10cm diameter to the left mid-medial thigh. Compartment is soft. No edema to the legs bilaterally.    Pt has mild effusion, ecchymosis, tenderness to the right anterior knee.   There is TTP to the right gluteus region.   No significant tenderness to the spine.   Neurological: He is alert.  Skin: No pallor.  Psychiatric: He has a normal mood and affect. His behavior is normal.  Nursing note and vitals reviewed.  ED Treatments / Results  DIAGNOSTIC STUDIES: Oxygen Saturation is 100% on RA, normal by my  interpretation.   COORDINATION OF CARE: 9:50 AM-Discussed next steps with pt. Pt verbalized understanding and is agreeable with the plan.   Labs (all labs ordered are listed, but only abnormal results are displayed) Labs Reviewed  CBC WITH DIFFERENTIAL/PLATELET - Abnormal; Notable for the following:       Result Value   Monocytes Absolute 1.1 (*)    All other components within normal limits  COMPREHENSIVE METABOLIC PANEL - Abnormal; Notable for the following:    Sodium 134 (*)    Potassium 2.8 (*)    Chloride 100 (*)    Glucose, Bld 107 (*)    All other components within normal limits  LIPASE, BLOOD - Abnormal; Notable for the following:    Lipase 10 (*)    All other components within normal limits   EKG  EKG Interpretation None      Radiology Dg Shoulder Right  Result Date: 10/20/2016 CLINICAL DATA:  Right shoulder pain after fall three days ago from ladder. EXAM: RIGHT SHOULDER - 2+ VIEW COMPARISON:  None. FINDINGS:  There is no evidence of fracture or dislocation. There is no evidence of arthropathy or other focal bone abnormality. Soft tissues are unremarkable. IMPRESSION: Normal right shoulder. Electronically Signed   By: Lupita Raider, M.D.   On: 10/20/2016 10:35   Dg Knee Complete 4 Views Right  Result Date: 10/20/2016 CLINICAL DATA:  Recent fall from ladder.  Right knee pain. EXAM: RIGHT KNEE - COMPLETE 4+ VIEW COMPARISON:  None. FINDINGS: Small to moderate suprapatellar right knee joint effusion. Mild prepatellar soft tissue swelling. No fracture or dislocation. No suspicious focal osseous lesion. No appreciable degenerative or erosive arthropathy. No radiopaque foreign body. IMPRESSION: Small to moderate suprapatellar right knee joint effusion. Mild prepatellar soft tissue swelling. No fracture or malalignment. Electronically Signed   By: Delbert Phenix M.D.   On: 10/20/2016 10:36   Dg Hip Unilat With Pelvis 2-3 Views Right  Result Date: 10/20/2016 CLINICAL DATA:  Right  hip pain after fall 3 days ago. EXAM: DG HIP (WITH OR WITHOUT PELVIS) 2-3V RIGHT COMPARISON:  None. FINDINGS: There is no evidence of hip fracture or dislocation. There is no evidence of arthropathy or other focal bone abnormality. IMPRESSION: Normal right hip. Electronically Signed   By: Lupita Raider, M.D.   On: 10/20/2016 10:40    Procedures Procedures   Medications Ordered in ED Medications  potassium chloride (KLOR-CON) packet 40 mEq (not administered)  HYDROcodone-acetaminophen (NORCO/VICODIN) 5-325 MG per tablet 2 tablet (2 tablets Oral Given 10/20/16 0957)    Initial Impression / Assessment and Plan / ED Course  I have reviewed the triage vital signs and the nursing notes.  Pertinent labs & imaging results that were available during my care of the patient were reviewed by me and considered in my medical decision making (see chart for details).   patient presents with multiple injuries from falling off a ladder on Wednesday. Injuries are healing well patient ambulating. X-rays no acute fracture. Discussed supportive care. Patient also having mild abdominal pain similar to his bowel disease history. Patient has benign abdominal exam. Discussed watching weight and start prednisone if abdominal pain worsens or if he develops diarrhea or blood in the stools. This is similar to previous for him. On recheck patient improved discuss results and plan.  Results and differential diagnosis were discussed with the patient/parent/guardian. Xrays were independently reviewed by myself.  Close follow up outpatient was discussed, comfortable with the plan.   Medications  potassium chloride (KLOR-CON) packet 40 mEq (not administered)  HYDROcodone-acetaminophen (NORCO/VICODIN) 5-325 MG per tablet 2 tablet (2 tablets Oral Given 10/20/16 0957)    Vitals:   10/20/16 0847 10/20/16 0848 10/20/16 0900 10/20/16 0930  BP: (!) 184/109  169/94 178/90  Pulse: 76  79 71  Resp: 20     Temp: 97.8 F (36.6 C)      TempSrc: Oral     SpO2: 100%  98% 100%  Weight:  175 lb (79.4 kg)    Height:  5\' 9"  (1.753 m)      Final diagnoses:  Fall  Central abdominal pain  Effusion of right knee joint  Strain of right shoulder, initial encounter     Final Clinical Impressions(s) / ED Diagnoses   Final diagnoses:  Fall  Central abdominal pain  Effusion of right knee joint  Strain of right shoulder, initial encounter   New Prescriptions New Prescriptions   POTASSIUM CHLORIDE SA (K-DUR,KLOR-CON) 20 MEQ TABLET    Take 1 tablet (20 mEq total) by mouth daily.  PREDNISONE (DELTASONE) 20 MG TABLET    2 tabs po daily x 4 days        Blane OharaJoshua Ivyanna Sibert, MD 10/20/16 1133

## 2016-10-20 NOTE — Discharge Instructions (Signed)
Start prednisone if you develop worsening abdominal pain, diarrhea, blood in your stools.  If you were given medicines take as directed.  If you are on coumadin or contraceptives realize their levels and effectiveness is altered by many different medicines.  If you have any reaction (rash, tongues swelling, other) to the medicines stop taking and see a physician.    If your blood pressure was elevated in the ER make sure you follow up for management with a primary doctor or return for chest pain, shortness of breath or stroke symptoms.  Please follow up as directed and return to the ER or see a physician for new or worsening symptoms.  Thank you. Vitals:   10/20/16 0847 10/20/16 0848 10/20/16 0900 10/20/16 0930  BP: (!) 184/109  169/94 178/90  Pulse: 76  79 71  Resp: 20     Temp: 97.8 F (36.6 C)     TempSrc: Oral     SpO2: 100%  98% 100%  Weight:  175 lb (79.4 kg)    Height:  5\' 9"  (1.753 m)

## 2016-10-20 NOTE — ED Triage Notes (Signed)
Pt states he had a fall off of a ladder on Wednesday. He fell approx. 5 feet onto his right buttocks. He had bruising to his left leg as well.   In addition to this, pt is having lower abdominal pain that started on Thursday. He has hx of ulcerative colitis and states this feels the same.

## 2018-06-30 DIAGNOSIS — K5190 Ulcerative colitis, unspecified, without complications: Secondary | ICD-10-CM | POA: Insufficient documentation

## 2018-07-21 DIAGNOSIS — F5104 Psychophysiologic insomnia: Secondary | ICD-10-CM | POA: Insufficient documentation

## 2018-07-21 DIAGNOSIS — F411 Generalized anxiety disorder: Secondary | ICD-10-CM | POA: Insufficient documentation

## 2019-11-02 DIAGNOSIS — I10 Essential (primary) hypertension: Secondary | ICD-10-CM | POA: Insufficient documentation

## 2020-10-21 DIAGNOSIS — F17210 Nicotine dependence, cigarettes, uncomplicated: Secondary | ICD-10-CM | POA: Insufficient documentation

## 2021-05-31 DIAGNOSIS — F419 Anxiety disorder, unspecified: Secondary | ICD-10-CM | POA: Insufficient documentation

## 2021-05-31 DIAGNOSIS — I2510 Atherosclerotic heart disease of native coronary artery without angina pectoris: Secondary | ICD-10-CM | POA: Insufficient documentation

## 2021-10-10 ENCOUNTER — Emergency Department (HOSPITAL_COMMUNITY): Payer: Medicare HMO

## 2021-10-10 ENCOUNTER — Emergency Department (HOSPITAL_COMMUNITY)
Admission: EM | Admit: 2021-10-10 | Discharge: 2021-10-10 | Disposition: A | Discharge status: Home / Self Care | Payer: Medicare HMO | Attending: Emergency Medicine | Admitting: Emergency Medicine

## 2021-10-10 ENCOUNTER — Encounter (HOSPITAL_COMMUNITY): Payer: Self-pay

## 2021-10-10 ENCOUNTER — Other Ambulatory Visit: Payer: Self-pay

## 2021-10-10 DIAGNOSIS — I10 Essential (primary) hypertension: Secondary | ICD-10-CM | POA: Diagnosis not present

## 2021-10-10 DIAGNOSIS — K648 Other hemorrhoids: Secondary | ICD-10-CM | POA: Diagnosis not present

## 2021-10-10 DIAGNOSIS — R110 Nausea: Secondary | ICD-10-CM | POA: Diagnosis not present

## 2021-10-10 DIAGNOSIS — R1030 Lower abdominal pain, unspecified: Secondary | ICD-10-CM | POA: Insufficient documentation

## 2021-10-10 DIAGNOSIS — Z79899 Other long term (current) drug therapy: Secondary | ICD-10-CM | POA: Diagnosis not present

## 2021-10-10 DIAGNOSIS — K625 Hemorrhage of anus and rectum: Secondary | ICD-10-CM

## 2021-10-10 DIAGNOSIS — R531 Weakness: Secondary | ICD-10-CM | POA: Diagnosis not present

## 2021-10-10 LAB — CBC
HCT: 45.4 % (ref 39.0–52.0)
Hemoglobin: 15.8 g/dL (ref 13.0–17.0)
MCH: 34.3 pg — ABNORMAL HIGH (ref 26.0–34.0)
MCHC: 34.8 g/dL (ref 30.0–36.0)
MCV: 98.7 fL (ref 80.0–100.0)
Platelets: 327 10*3/uL (ref 150–400)
RBC: 4.6 MIL/uL (ref 4.22–5.81)
RDW: 13.2 % (ref 11.5–15.5)
WBC: 9.5 10*3/uL (ref 4.0–10.5)
nRBC: 0 % (ref 0.0–0.2)

## 2021-10-10 LAB — COMPREHENSIVE METABOLIC PANEL
ALT: 25 U/L (ref 0–44)
AST: 34 U/L (ref 15–41)
Albumin: 4.4 g/dL (ref 3.5–5.0)
Alkaline Phosphatase: 60 U/L (ref 38–126)
Anion gap: 9 (ref 5–15)
BUN: 6 mg/dL — ABNORMAL LOW (ref 8–23)
CO2: 26 mmol/L (ref 22–32)
Calcium: 9.1 mg/dL (ref 8.9–10.3)
Chloride: 99 mmol/L (ref 98–111)
Creatinine, Ser: 0.8 mg/dL (ref 0.61–1.24)
GFR, Estimated: >60 mL/min (ref >60)
Glucose, Bld: 101 mg/dL — ABNORMAL HIGH (ref 70–99)
Potassium: 3.5 mmol/L (ref 3.5–5.1)
Sodium: 134 mmol/L — ABNORMAL LOW (ref 135–145)
Total Bilirubin: 0.5 mg/dL (ref 0.3–1.2)
Total Protein: 7.4 g/dL (ref 6.5–8.1)

## 2021-10-10 LAB — TYPE AND SCREEN
ABO/RH(D): O POS
Antibody Screen: NEGATIVE

## 2021-10-10 LAB — LIPASE, BLOOD: Lipase: 23 U/L (ref 11–51)

## 2021-10-10 MED ORDER — SODIUM CHLORIDE 0.9 % IV BOLUS
500.0000 mL | Freq: Once | INTRAVENOUS | Status: AC
  Administered 2021-10-10: 500 mL via INTRAVENOUS

## 2021-10-10 MED ORDER — IOHEXOL 300 MG/ML  SOLN
100.0000 mL | Freq: Once | INTRAMUSCULAR | Status: AC | PRN
  Administered 2021-10-10: 100 mL via INTRAVENOUS

## 2021-10-10 MED ORDER — HYDROMORPHONE HCL 1 MG/ML IJ SOLN
1.0000 mg | Freq: Once | INTRAMUSCULAR | Status: AC
  Administered 2021-10-10: 1 mg via INTRAVENOUS
  Filled 2021-10-10: qty 1

## 2021-10-10 MED ORDER — ONDANSETRON HCL 4 MG/2ML IJ SOLN
4.0000 mg | Freq: Once | INTRAMUSCULAR | Status: AC
  Administered 2021-10-10: 4 mg via INTRAVENOUS
  Filled 2021-10-10: qty 2

## 2021-10-10 MED ORDER — HYDROCODONE-ACETAMINOPHEN 5-325 MG PO TABS: ORAL_TABLET | ORAL | 0 refills | Status: DC

## 2021-10-10 NOTE — Discharge Instructions (Signed)
The CT scan of your abdomen and pelvis today was reassuring as well as your hemoglobin level.  Your bleeding may be coming from your hemorrhoids, but this will need further evaluation by your GI provider.  Also as discussed, please contact your GI provider to arrange for colonoscopy.  Return to the emergency department for any new or worsening symptoms.

## 2021-10-10 NOTE — ED Notes (Signed)
Pt has finished Contrast drink

## 2021-10-10 NOTE — ED Provider Notes (Signed)
[facility] EMERGENCY DEPARTMENT Provider Note   CSN: [id number] Arrival date & time: 10/10/21  [id number]     History  Chief Complaint  Patient presents with   Abdominal Pain    Adrian Edwards is a 66 y.o. male.   Abdominal Pain Associated symptoms: diarrhea and nausea   Associated symptoms: no chest pain, no dysuria, no shortness of breath and no vomiting       Adrian Edwards is a 66 y.o. male with past medical history of ulcerative colitis, internal hemorrhoids, hypertension, and anemia who presents to the Emergency Department complaining of abdominal pain and rectal bleeding.  He has several episodes of rectal bleeding intermittently since Christmas.  Symptoms worse x1 week.  This morning, he notes having pain of his lower mid abdomen.  Grabs pain is sharp.  Pain is nonradiating.  He states that he has watery diarrhea with some stool and notes having bright red blood.  He has internal hemorrhoids that were recently treated with banding.  Here today due to sudden onset of abdominal pain.  Pain has been associated with nausea.  Symptoms also associated with some generalized weakness but denies dizziness or syncope.  Also denies any fever, vomiting, chest pain or shortness of breath.  Does not take any anticoagulants.   Home Medications Prior to Admission medications   Medication Sig Start Date End Date Taking? Authorizing Provider  acetaminophen (TYLENOL) 500 MG tablet Take 1,000 mg by mouth every 6 (six) hours as needed. Pain    [provider]  ALPRAZolam Duanne Moron) 1 MG tablet Take 1 mg by mouth 4 (four) times daily.     [provider]  escitalopram (LEXAPRO) 10 MG tablet Take 10 mg by mouth daily.    [provider]  fluticasone (FLONASE) 50 MCG/ACT nasal spray Place 1 spray into both nostrils daily.    [provider]  HYDROcodone-acetaminophen (NORCO) 7.5-325 MG per tablet Take 1 tablet by mouth every 6 (six) hours as needed for moderate pain. 11/10/14    Evalee Jefferson, PA-C  hydrOXYzine (ATARAX/VISTARIL) 25 MG tablet Take 25 mg by mouth 3 (three) times daily as needed. Itching    [provider]  Multiple Vitamin (MULTIVITAMIN WITH MINERALS) TABS Take 1 tablet by mouth daily.    [provider]  potassium chloride SA (K-DUR,KLOR-CON) 20 MEQ tablet Take 1 tablet (20 mEq total) by mouth daily. 10/20/16   Elnora Morrison, MD  predniSONE (DELTASONE) 20 MG tablet 2 tabs po daily x 4 days 10/20/16   Elnora Morrison, MD  sulfaSALAzine (AZULFIDINE) 500 MG tablet Take 1,000 mg by mouth 3 (three) times daily.    [provider]  telmisartan (MICARDIS) 80 MG tablet Take 1 tablet by mouth daily. 11/01/14   [provider]  triamcinolone cream (KENALOG) 0.1 % Apply 1 application topically 2 (two) times daily. 10/20/14   [provider]      Allergies    Aspirin and Ketorolac tromethamine    Review of Systems   Review of Systems  Respiratory:  Negative for shortness of breath.   Cardiovascular:  Negative for chest pain.  Gastrointestinal:  Positive for abdominal pain, anal bleeding, diarrhea and nausea. Negative for abdominal distention and vomiting.  Genitourinary:  Negative for dysuria, penile discharge and urgency.  All other systems reviewed and are negative.  Physical Exam Updated Vital Signs BP (!) 187/110 (BP Location: Left Arm)    Pulse 92    Temp 98.5 F (36.9 C) (Oral)  Resp 18    Ht 5\' 10"  (1.778 m)    Wt 77.1 kg    SpO2 99%    BMI 24.39 kg/m  Physical Exam Vitals and nursing note reviewed.  Constitutional:      General: He is not in acute distress.    Appearance: Normal appearance. He is well-developed. He is not ill-appearing.  HENT:     Mouth/Throat:     Mouth: Mucous membranes are moist.  Cardiovascular:     Rate and Rhythm: Normal rate and regular rhythm.     Pulses: Normal pulses.  Pulmonary:     Effort: Pulmonary effort is normal.     Breath sounds: Normal breath sounds.  Abdominal:      Palpations: Abdomen is soft.     Tenderness: There is abdominal tenderness.     Comments: Mild tenderness to palpation of the lower abdomen.  No guarding or rebound tenderness.  Abdomen is soft no distention.  Genitourinary:    Rectum: Guaiac result positive. Tenderness and internal hemorrhoid present. Normal anal tone.     Comments: DRE performed by me, brown heme positive stool noted.  There are several palpable internal hemorrhoids present.  No abnormal rectal tone. Musculoskeletal:        General: Normal range of motion.  Skin:    General: Skin is warm.     Capillary Refill: Capillary refill takes less than 2 seconds.  Neurological:     General: No focal deficit present.     Mental Status: He is alert.     Sensory: No sensory deficit.     Motor: No weakness.    ED Results / Procedures / Treatments   Labs (all labs ordered are listed, but only abnormal results are displayed) Labs Reviewed  COMPREHENSIVE METABOLIC PANEL - Abnormal; Notable for the following components:      Result Value   Sodium 134 (*)    Glucose, Bld 101 (*)    BUN 6 (*)    All other components within normal limits  CBC - Abnormal; Notable for the following components:   MCH 34.3 (*)    All other components within normal limits  LIPASE, BLOOD  POC OCCULT BLOOD, ED  TYPE AND SCREEN    EKG None  Radiology CT ABDOMEN PELVIS W CONTRAST  Result Date: 10/10/2021 CLINICAL DATA:  Abdominal pain and bloody diarrhea for 1 month. Ulcerative colitis. EXAM: CT ABDOMEN AND PELVIS WITH CONTRAST TECHNIQUE: Multidetector CT imaging of the abdomen and pelvis was performed using the standard protocol following bolus administration of intravenous contrast. RADIATION DOSE REDUCTION: This exam was performed according to the departmental dose-optimization program which includes automated exposure control, adjustment of the mA and/or kV according to patient size and/or use of iterative reconstruction technique. CONTRAST:   12mL OMNIPAQUE IOHEXOL 300 MG/ML  SOLN COMPARISON:  11/10/2014 FINDINGS: Lower Chest: No acute findings. Hepatobiliary: No hepatic masses identified. Gallbladder is unremarkable. No evidence of biliary ductal dilatation. Pancreas:  No mass or inflammatory changes. Spleen: Within normal limits in size and appearance. Adrenals/Urinary Tract: No masses identified. A few small renal cysts are again seen. No evidence of ureteral calculi or hydronephrosis. Urinary bladder is distended, but otherwise unremarkable in appearance. Stomach/Bowel: No evidence of obstruction, inflammatory process or abnormal fluid collections. Normal appendix visualized. Vascular/Lymphatic: No pathologically enlarged lymph nodes. No acute vascular findings. Aortic atherosclerotic calcification noted. Reproductive:  No mass or other significant abnormality. Other:  None. Musculoskeletal:  No suspicious bone lesions identified. IMPRESSION: No radiographic  signs of inflammatory bowel disease or complication. Distended urinary bladder. Recommend clinical correlation to exclude urinary retention. Aortic Atherosclerosis (ICD10-I70.0). Electronically Signed   By: Marlaine Hind M.D.   On: 10/10/2021 13:41    Procedures Procedures    Medications Ordered in ED Medications  HYDROmorphone (DILAUDID) injection 1 mg (1 mg Intravenous Given 10/10/21 1102)  ondansetron (ZOFRAN) injection 4 mg (4 mg Intravenous Given 10/10/21 1100)  sodium chloride 0.9 % bolus 500 mL (0 mLs Intravenous Stopped 10/10/21 1220)  iohexol (OMNIPAQUE) 300 MG/ML solution 100 mL (100 mLs Intravenous Contrast Given 10/10/21 1313)    ED Course/ Medical Decision Making/ A&P                           Medical Decision Making Patient here for evaluation of rectal bleeding and abdominal pain.  Has history of UC and internal hemorrhoids.  Recent hemorrhoid banding.  Complains of nausea but no vomiting  Last colonoscopy was 2018.    Amount and/or Complexity of Data  Reviewed External Data Reviewed: notes.    Details: Medical records reviewed from recent GI visit with [facility]. Labs: ordered.    Details: Labs interpreted by me show no leukocytosis.  Hemoglobin is reassuring at 15.8.  Electrolytes essentially unremarkable.  Lipase unremarkable. Radiology: ordered.    Details: CT abdomen and pelvis show no sign of inflammatory bowel disease or complication.  I have reviewed radiology imaging and agree with interpretation.  Risk Prescription drug management.   Work-up today without acute process.  His hemoglobin is reassuring.  He does have heme positive stool on digital rectal exam but also has several palpable internal hemorrhoids.  No abnormal rectal tone.  I suspect his rectal bleeding is secondary to his hemorrhoids, but with his history of UC patient really needs colonoscopy for further evaluation.  I have discussed this in detail with him.  On recheck, patient is feeling better.  Reports some continued nausea but pain improved.  He has close GI follow-up with [facility].  I feel that he is appropriate for discharge home.  He is hypertensive here does take antihypertensive medications but has not done so today.  Agrees to take his medication when he arrives home.  No evidence of endorgan damage.        Final Clinical Impression(s) / ED Diagnoses Final diagnoses:  Lower abdominal pain  Rectal bleeding  Internal hemorrhoids    Rx / DC Orders ED Discharge Orders     None         Kem Parkinson, PA-C 10/10/21 1458    Horton, Alvin Critchley, DO 10/10/21 1530

## 2021-10-10 NOTE — ED Triage Notes (Signed)
Patient complaining of abdominal pain and bloody diarrhea for over a month.

## 2022-01-01 DIAGNOSIS — K219 Gastro-esophageal reflux disease without esophagitis: Secondary | ICD-10-CM | POA: Insufficient documentation

## 2023-02-14 ENCOUNTER — Encounter: Payer: Self-pay | Admitting: *Deleted

## 2023-06-16 IMAGING — CT CT ABD-PELV W/ CM
2 of 5 series · 16 of 46 positions shown, 18 images · IV contrast (Omnipaque or Isovue)
Comparison: 11/10/2014

CLINICAL DATA: Abdominal pain and bloody diarrhea for 1 month.
Ulcerative colitis.

EXAM:
CT ABDOMEN AND PELVIS WITH CONTRAST
TECHNIQUE: Multidetector CT imaging of the abdomen and pelvis was performed
using the standard protocol following bolus administration of
intravenous contrast.

[Series 2: axial st · axial · 0.71mm/px · z∈[+820,+1270]mm · 13 of 102 slices shown, 15 images]
[im 6/102  soft-tissue]
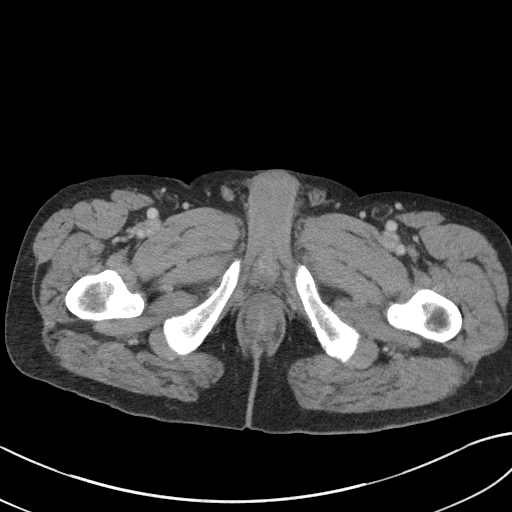
[im 6/102  bone]
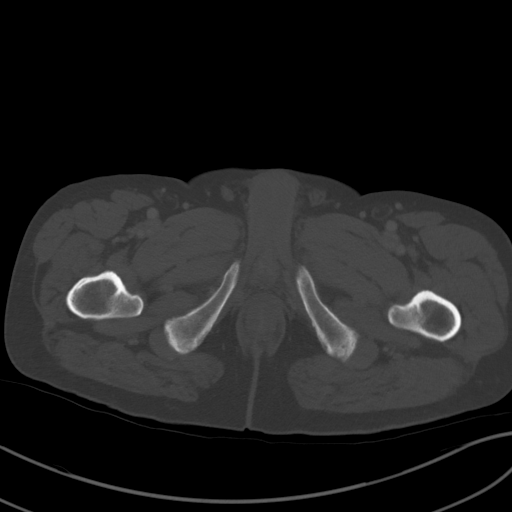
[im 16/102  soft-tissue]
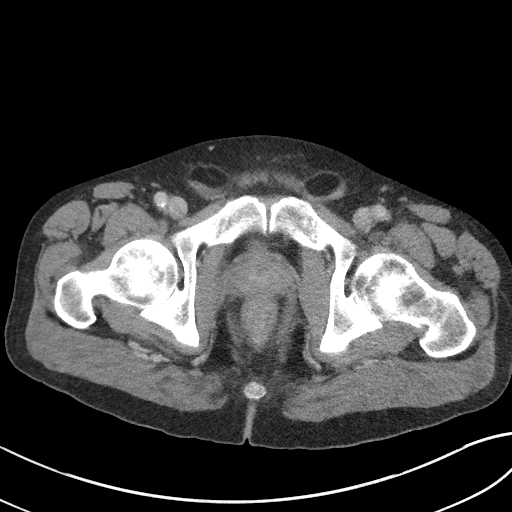
[im 22/102  soft-tissue]
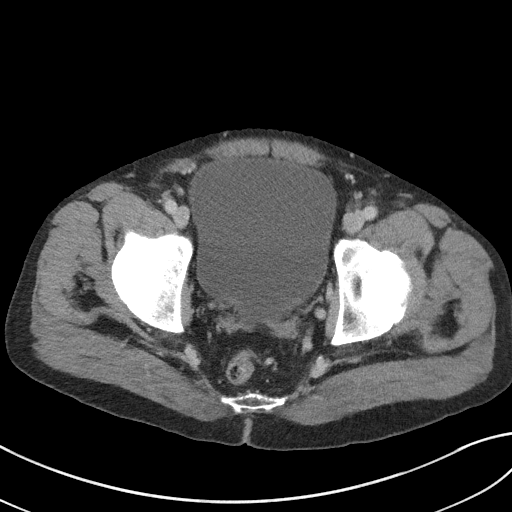
[im 27/102  soft-tissue]
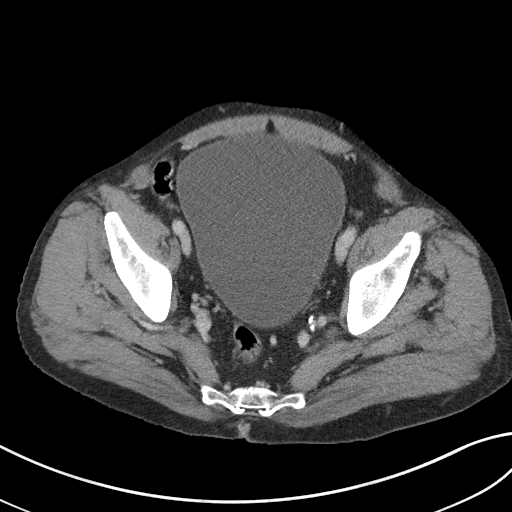
[im 38/102  soft-tissue]
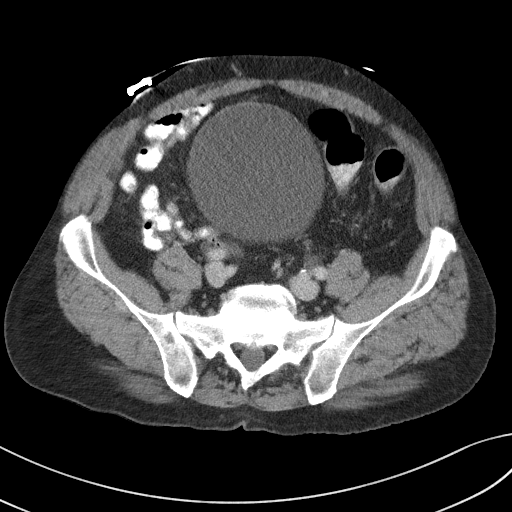
[im 43/102  soft-tissue]
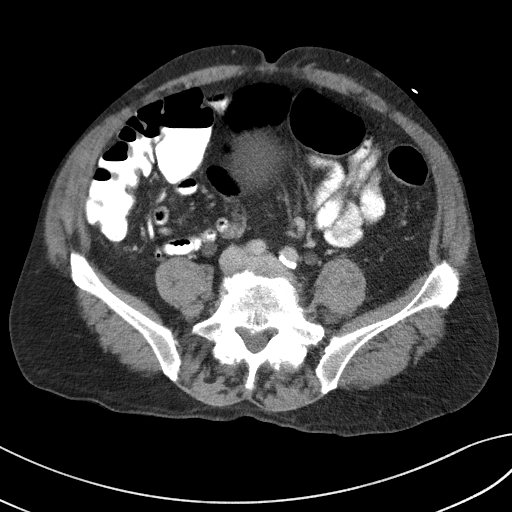
[im 54/102  soft-tissue]
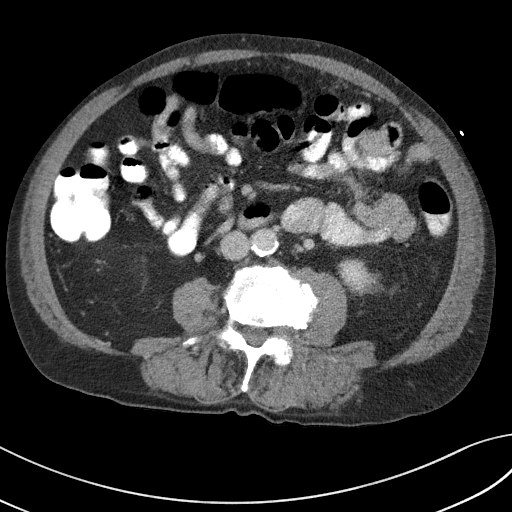
[im 59/102  soft-tissue]
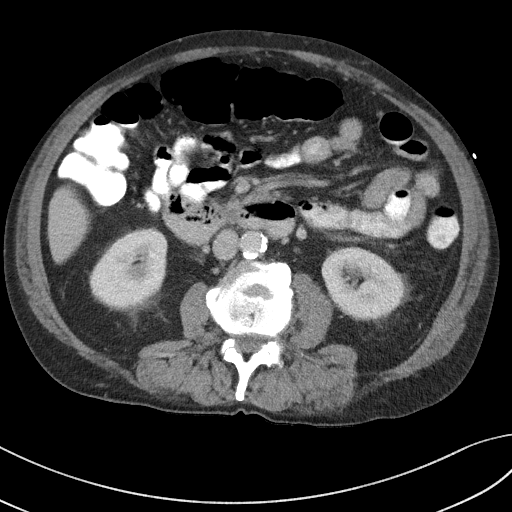
[im 64/102  soft-tissue]
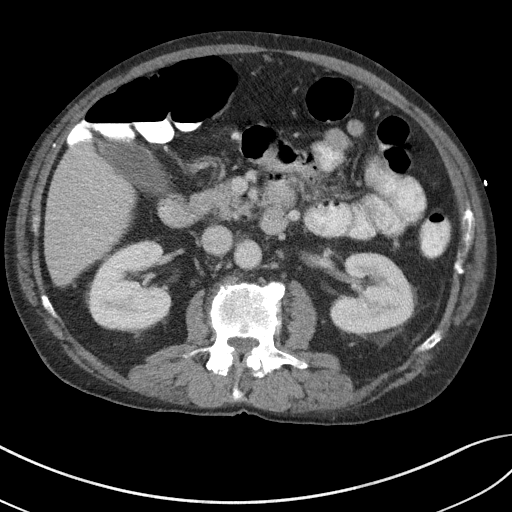
[im 64/102  bone]
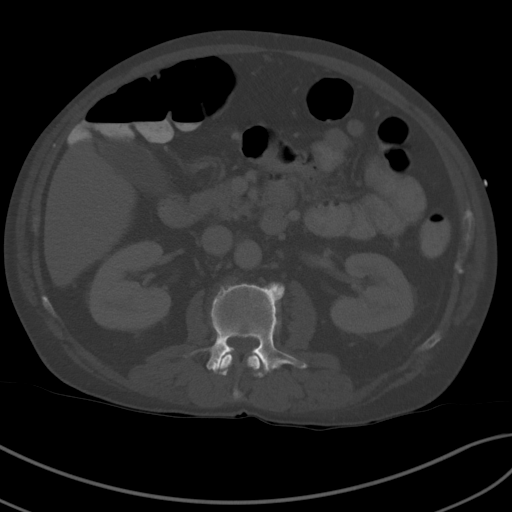
[im 75/102  soft-tissue]
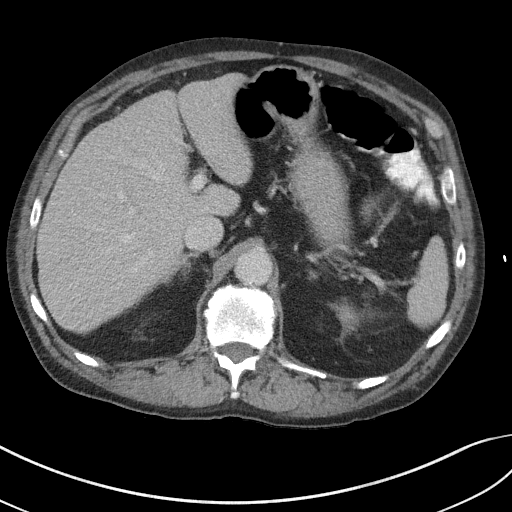
[im 80/102  soft-tissue]
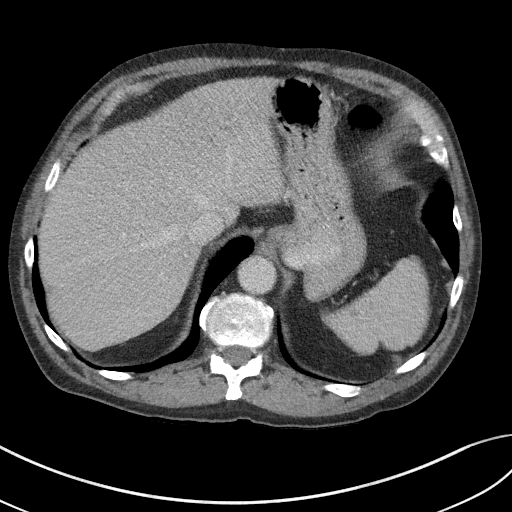
[im 86/102  soft-tissue]
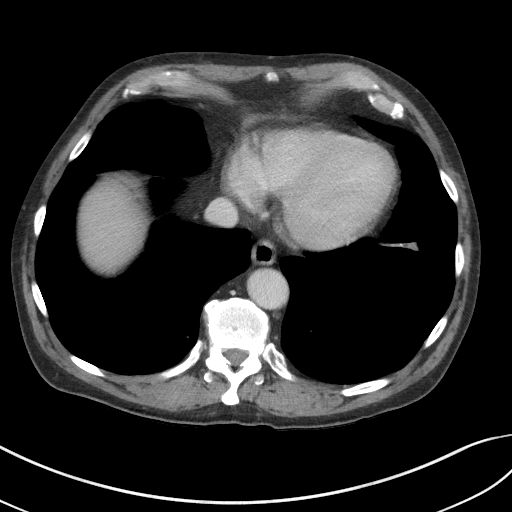
[im 96/102  soft-tissue]
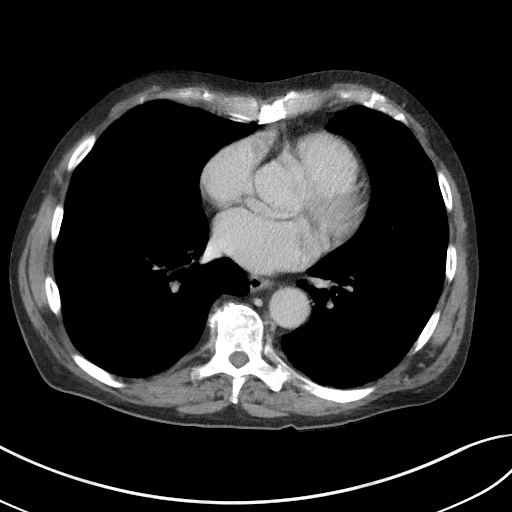

[Series 6: coronal st · coronal · 0.89mm/px · 3 of 113 slices shown]
[im 38/113  soft-tissue]
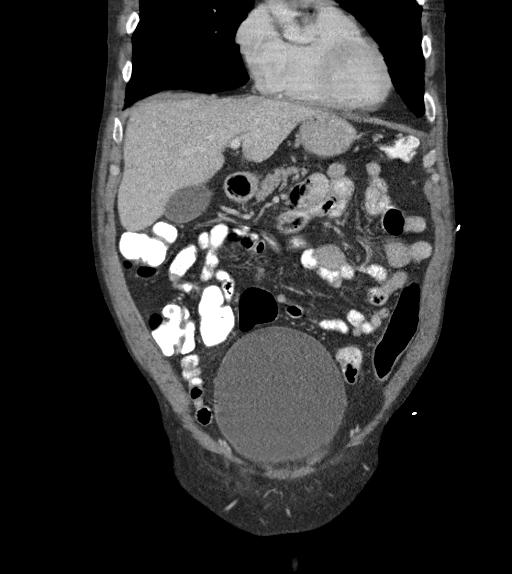
[im 50/113  soft-tissue]
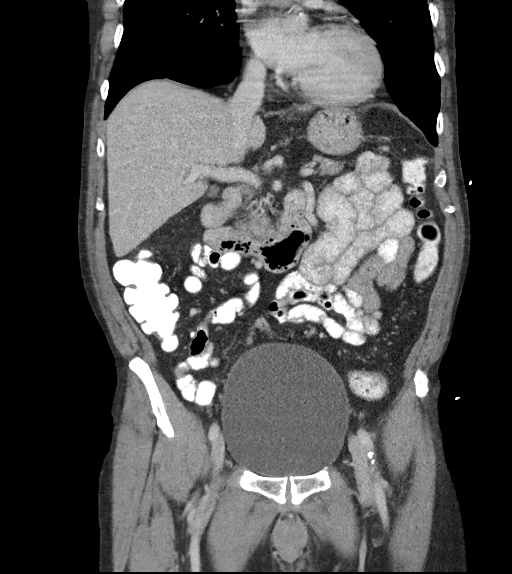
[im 63/113  soft-tissue]
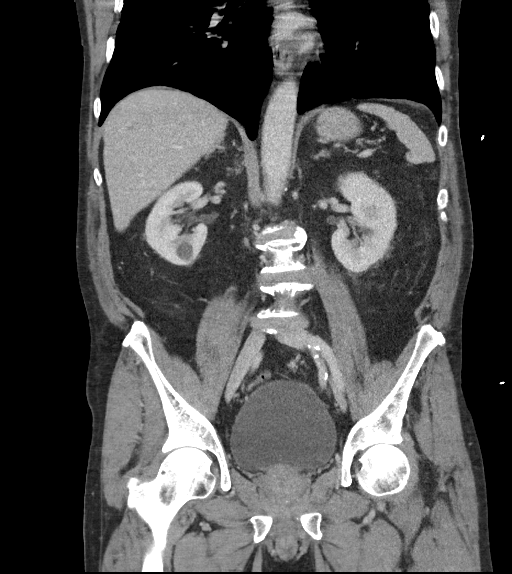

[16 of 46 positions shown; findings below may reference images not displayed]

RADIATION DOSE REDUCTION: This exam was performed according to the
departmental dose-optimization program which includes automated
exposure control, adjustment of the mA and/or kV according to
patient size and/or use of iterative reconstruction technique.

CONTRAST:  100mL OMNIPAQUE IOHEXOL 300 MG/ML  SOLN
FINDINGS: Lower Chest: No acute findings.

Hepatobiliary: No hepatic masses identified. Gallbladder is
unremarkable. No evidence of biliary ductal dilatation.

Pancreas:  No mass or inflammatory changes.

Spleen: Within normal limits in size and appearance.

Adrenals/Urinary Tract: No masses identified. A few small renal
cysts are again seen. No evidence of ureteral calculi or
hydronephrosis. Urinary bladder is distended, but otherwise
unremarkable in appearance.

Stomach/Bowel: No evidence of obstruction, inflammatory process or
abnormal fluid collections. Normal appendix visualized.

Vascular/Lymphatic: No pathologically enlarged lymph nodes. No acute
vascular findings. Aortic atherosclerotic calcification noted.

Reproductive:  No mass or other significant abnormality.

Other:  None.

Musculoskeletal:  No suspicious bone lesions identified.
IMPRESSION: No radiographic signs of inflammatory bowel disease or complication.

Distended urinary bladder. Recommend clinical correlation to exclude
urinary retention.

Aortic Atherosclerosis ([id number]).

## 2023-08-19 ENCOUNTER — Encounter (INDEPENDENT_AMBULATORY_CARE_PROVIDER_SITE_OTHER): Payer: Self-pay | Admitting: *Deleted

## 2024-06-01 DIAGNOSIS — Z0001 Encounter for general adult medical examination with abnormal findings: Secondary | ICD-10-CM | POA: Insufficient documentation

## 2024-06-01 NOTE — Progress Notes (Deleted)
 Subjective:  Patient ID: Adrian Edwards, male    DOB: August 11, 1956, 68 y.o.   MRN: [id number]  Patient Care Team: Benjamin Raina Elizabeth, NP as PCP - General   Chief Complaint:  No chief complaint on file.   HPI: Adrian Edwards is a 68 y.o. male presenting on 06/02/2024 for No chief complaint on file.   Discussed the use of AI scribe software for clinical note transcription with the patient, who gave verbal consent to proceed.  History of Present Illness       Relevant past medical, surgical, family, and social history reviewed and updated as indicated.  Allergies and medications reviewed and updated. Data reviewed: Chart in Epic.   Past Medical History:  Diagnosis Date   Anemia    B12 deficiency    Dermatitis    Hypertension    Ulcerative colitis (HCC)     No past surgical history on file.  Social History   Socioeconomic History   Marital status: Divorced    Spouse name: Not on file   Number of children: Not on file   Years of education: Not on file   Highest education level: Not on file  Occupational History   Not on file  Tobacco Use   Smoking status: Every Day    Current packs/day: 0.50    Types: Cigarettes   Smokeless tobacco: Never  Substance and Sexual Activity   Alcohol use: No   Drug use: No   Sexual activity: Not on file  Other Topics Concern   Not on file  Social History Narrative   Not on file   Social Drivers of Health   Financial Resource Strain: High Risk (02/06/2024)   Received from [facility]   Overall Financial Resource Strain (CARDIA)    Difficulty of Paying Living Expenses: Hard  Food Insecurity: No Food Insecurity (02/06/2024)   Received from [facility]   Hunger Vital Sign    Within the past 12 months, you worried that your food would run out before you got the money to buy more.: Never true    Within the past 12 months, the food you bought just didn't last and you didn't have money to get more.: Never true   Transportation Needs: Unmet Transportation Needs (02/06/2024)   Received from [facility] - Transportation    Lack of Transportation (Medical): Yes    Lack of Transportation (Non-Medical): No  Physical Activity: Not on file  Stress: Not on file  Social Connections: Unknown (12/20/2021)   Received from [facility]   Social Network    Social Network: Not on file  Intimate Partner Violence: Unknown (11/21/2021)   Received from [facility]   HITS    Physically Hurt: Not on file    Insult or Talk Down To: Not on file    Threaten Physical Harm: Not on file    Scream or Curse: Not on file    Outpatient Encounter Medications as of 06/02/2024  Medication Sig   acetaminophen  (TYLENOL ) 500 MG tablet Take 1,000 mg by mouth every 6 (six) hours as needed. Pain   amLODipine (NORVASC) 5 MG tablet Take 5 mg by mouth daily.   cetirizine (ZYRTEC) 10 MG tablet Take 1 tablet by mouth daily as needed for allergies.   dicyclomine (BENTYL) 10 MG capsule Take 10 mg by mouth every 6 (six) hours as needed for spasms.   diphenoxylate-atropine (LOMOTIL) 2.5-0.025 MG tablet Take 1 tablet by mouth 4 (four) times daily  as needed for diarrhea or loose stools.   HYDROcodone -acetaminophen  (NORCO/VICODIN) 5-325 MG tablet Take one tab po q 4 hrs prn pain   hydrOXYzine (ATARAX) 25 MG tablet Take 1 tablet by mouth in the morning and at bedtime.   ondansetron  (ZOFRAN -ODT) 4 MG disintegrating tablet Take by mouth.   potassium chloride  SA (K-DUR,KLOR-CON ) 20 MEQ tablet Take 1 tablet (20 mEq total) by mouth daily. (Patient not taking: Reported on 10/10/2021)   predniSONE  (DELTASONE ) 20 MG tablet 2 tabs po daily x 4 days (Patient not taking: Reported on 10/10/2021)   sulfaSALAzine (AZULFIDINE) 500 MG tablet Take 1,000 mg by mouth 3 (three) times daily.   telmisartan (MICARDIS) 80 MG tablet Take 1 tablet by mouth daily.   triamcinolone cream (KENALOG) 0.1 % Apply 1 application topically 2 (two) times daily.   No  facility-administered encounter medications on file as of 06/02/2024.    Allergies  Allergen Reactions   Aspirin Other (See Comments)    Colon disturbance per patient   Ketorolac  Tromethamine  Itching    Pertinent ROS per HPI, otherwise unremarkable      Objective:  There were no vitals taken for this visit.   Wt Readings from Last 3 Encounters:  10/10/21 170 lb (77.1 kg)  10/20/16 175 lb (79.4 kg)  11/10/14 180 lb (81.6 kg)    Physical Exam Physical Exam      Results for orders placed or performed during the hospital encounter of 10/10/21  Comprehensive metabolic panel   Collection Time: 10/10/21 10:03 AM  Result Value Ref Range   Sodium 134 (L) 135 - 145 mmol/L   Potassium 3.5 3.5 - 5.1 mmol/L   Chloride 99 98 - 111 mmol/L   CO2 26 22 - 32 mmol/L   Glucose, Bld 101 (H) 70 - 99 mg/dL   BUN 6 (L) 8 - 23 mg/dL   Creatinine, Ser 9.19 0.61 - 1.24 mg/dL   Calcium 9.1 8.9 - 89.6 mg/dL   Total Protein 7.4 6.5 - 8.1 g/dL   Albumin 4.4 3.5 - 5.0 g/dL   AST 34 15 - 41 U/L   ALT 25 0 - 44 U/L   Alkaline Phosphatase 60 38 - 126 U/L   Total Bilirubin 0.5 0.3 - 1.2 mg/dL   GFR, Estimated >39 >39 mL/min   Anion gap 9 5 - 15  CBC   Collection Time: 10/10/21 10:03 AM  Result Value Ref Range   WBC 9.5 4.0 - 10.5 K/uL   RBC 4.60 4.22 - 5.81 MIL/uL   Hemoglobin 15.8 13.0 - 17.0 g/dL   HCT 54.5 60.9 - 47.9 %   MCV 98.7 80.0 - 100.0 fL   MCH 34.3 (H) 26.0 - 34.0 pg   MCHC 34.8 30.0 - 36.0 g/dL   RDW 86.7 88.4 - 84.4 %   Platelets 327 150 - 400 K/uL   nRBC 0.0 0.0 - 0.2 %  Type and screen [facility]   Collection Time: 10/10/21 10:05 AM  Result Value Ref Range   ABO/RH(D) O POS    Antibody Screen NEG    Sample Expiration      10/13/2021,2359 Performed at [facility], [address] [place], [place] [address]   Lipase, blood   Collection Time: 10/10/21 10:06 AM  Result Value Ref Range   Lipase 23 11 - 51 U/L       Pertinent labs & imaging results that  were available during my care of the patient were reviewed by me and considered  in my medical decision making.  Assessment & Plan:  There are no diagnoses linked to this encounter.   Assessment and Plan Assessment & Plan       Continue all other maintenance medications.  Follow up plan: No follow-ups on file.   Continue healthy lifestyle choices, including diet (rich in fruits, vegetables, and lean proteins, and low in salt and simple carbohydrates) and exercise (at least 30 minutes of moderate physical activity daily).  Educational handout given for ***  The above assessment and management plan was discussed with the patient. The patient verbalized understanding of and has agreed to the management plan. Patient is aware to call the clinic if they develop any new symptoms or if symptoms persist or worsen. Patient is aware when to return to the clinic for a follow-up visit. Patient educated on when it is appropriate to go to the emergency department.  @SIGNATURE @

## 2024-06-02 ENCOUNTER — Ambulatory Visit: Payer: Self-pay | Admitting: Nurse Practitioner

## 2024-07-21 ENCOUNTER — Ambulatory Visit: Payer: Self-pay | Admitting: Nurse Practitioner

## 2024-07-21 ENCOUNTER — Encounter: Payer: Self-pay | Admitting: Nurse Practitioner

## 2024-07-21 VITALS — BP 178/89 | HR 80 | Temp 98.9°F | Ht 70.0 in | Wt 158.8 lb

## 2024-07-21 DIAGNOSIS — E785 Hyperlipidemia, unspecified: Secondary | ICD-10-CM | POA: Insufficient documentation

## 2024-07-21 DIAGNOSIS — E538 Deficiency of other specified B group vitamins: Secondary | ICD-10-CM | POA: Insufficient documentation

## 2024-07-21 DIAGNOSIS — K5100 Ulcerative (chronic) pancolitis without complications: Secondary | ICD-10-CM

## 2024-07-21 DIAGNOSIS — R110 Nausea: Secondary | ICD-10-CM

## 2024-07-21 DIAGNOSIS — M179 Osteoarthritis of knee, unspecified: Secondary | ICD-10-CM | POA: Insufficient documentation

## 2024-07-21 DIAGNOSIS — D509 Iron deficiency anemia, unspecified: Secondary | ICD-10-CM | POA: Insufficient documentation

## 2024-07-21 DIAGNOSIS — Z0001 Encounter for general adult medical examination with abnormal findings: Secondary | ICD-10-CM

## 2024-07-21 MED ORDER — PROMETHAZINE HCL 50 MG/ML IJ SOLN
25.0000 mg | Freq: Once | INTRAMUSCULAR | Status: AC
  Administered 2024-07-21: 25 mg via INTRAMUSCULAR

## 2024-07-21 MED ORDER — PREDNISONE 20 MG PO TABS: ORAL_TABLET | ORAL | 0 refills | Status: AC

## 2024-07-21 MED ORDER — AMLODIPINE BESYLATE 10 MG PO TABS: 10.0000 mg | ORAL_TABLET | Freq: Every day | ORAL | 0 refills | Status: AC

## 2024-07-21 MED ORDER — PROMETHAZINE HCL 25 MG PO TABS: 25.0000 mg | ORAL_TABLET | Freq: Once | ORAL | Status: DC

## 2024-07-21 MED ORDER — PROMETHAZINE HCL 25 MG/ML IJ SOLN: 25.0000 mg | Freq: Four times a day (QID) | INTRAMUSCULAR | Status: DC | PRN

## 2024-07-21 NOTE — Addendum Note (Signed)
 Addended by: MILAS KENT D on: 07/21/2024 05:50 PM   Modules accepted: Orders

## 2024-07-21 NOTE — Addendum Note (Signed)
 Addended by: MILAS KENT D on: 07/21/2024 05:48 PM   Modules accepted: Orders

## 2024-07-21 NOTE — Progress Notes (Addendum)
 Subjective:  Patient ID: Adrian Edwards, male    DOB: 10/08/1955, 68 y.o.   MRN: [id number]  Patient Care Team: Deitra Morton Sebastian Nena, NP as PCP - General (Nurse Practitioner)   Chief Complaint:  New Patient (Initial Visit)   HPI: Adrian Edwards is a 68 y.o. male presenting on 07/21/2024 for New Patient (Initial Visit)   Discussed the use of AI scribe software for clinical note transcription with the patient, who gave verbal consent to proceed.  History of Present Illness Adrian Edwards is a 68 year old male with ulcerative colitis who presents with a flare-up of symptoms. He is accompanied by his sister.  He is experiencing a flare-up of ulcerative colitis that began on Sunday, characterized by nausea, diarrhea, and abdominal pain localized around the colon. Typically, symptoms subside after using the bathroom, but currently, the pain persists. He has not contacted his gastroenterologist regarding this flare-up. He is scheduled for a colonoscopy at the beginning of the year and is under the care of a GI specialist. Medications include sulfasalazine for colitis, simethicone, and Zofran for nausea. Prednisone has been effective in the past, but he has not taken it recently. No blood in stool, but he mentions having hemorrhoids.  He is on amlodipine 5 mg, which he did not take today due to lack of appetite. He has a history of high blood pressure readings, including a past reading of 200/unknown.  He smokes marijuana 3-5 times daily, which he believes helps with his colitis. He reports poor sleep, averaging about five hours per night, and experiences dry heaves in the morning. No current cigarette or alcohol use, though he has consumed alcohol occasionally in the past.  He has a history of anemia but is not currently taking an iron supplement. He experiences knee and ankle pain.       07/21/2024    3:58 PM  PHQ9 SCORE ONLY  PHQ-9 Total Score 6       12 /09/2023    4:00 PM  GAD 7 :  Generalized Anxiety Score  Nervous, Anxious, on Edge 3  Control/stop worrying 0  Worry too much - different things 2  Trouble relaxing 2  Restless 2  Easily annoyed or irritable 3  Afraid - awful might happen 0  Total GAD 7 Score 12      Relevant past medical, surgical, family, and social history reviewed and updated as indicated.  Allergies and medications reviewed and updated. Data reviewed: Chart in Epic.   Past Medical History:  Diagnosis Date   Anemia    B12 deficiency    Dermatitis    Hypertension    Ulcerative colitis (HCC)     History reviewed. No pertinent surgical history.  Social History   Socioeconomic History   Marital status: Divorced    Spouse name: Not on file   Number of children: Not on file   Years of education: Not on file   Highest education level: Not on file  Occupational History   Not on file  Tobacco Use   Smoking status: Every Day    Current packs/day: 0.50    Types: Cigarettes   Smokeless tobacco: Never  Substance and Sexual Activity   Alcohol use: No   Drug use: No   Sexual activity: Not on file  Other Topics Concern   Not on file  Social History Narrative   Not on file   Social Drivers of Health   Financial Resource Strain: High Risk (02/06/2024)  Received from [facility]   Overall Financial Resource Strain (CARDIA)    Difficulty of Paying Living Expenses: Hard  Food Insecurity: No Food Insecurity (07/21/2024)   Hunger Vital Sign    Worried About Running Out of Food in the Last Year: Never true    Ran Out of Food in the Last Year: Never true  Transportation Needs: Unmet Transportation Needs (07/21/2024)   PRAPARE - Administrator, Civil Service (Medical): Yes    Lack of Transportation (Non-Medical): Yes  Physical Activity: Not on file  Stress: Not on file  Social Connections: Not on file  Intimate Partner Violence: Not At Risk (07/21/2024)   Humiliation, Afraid, Rape, and Kick questionnaire    Fear of  Current or Ex-Partner: No    Emotionally Abused: No    Physically Abused: No    Sexually Abused: No    Outpatient Encounter Medications as of 07/21/2024  Medication Sig   acetaminophen  (TYLENOL ) 500 MG tablet Take 1,000 mg by mouth every 6 (six) hours as needed. Pain   amLODipine (NORVASC) 10 MG tablet Take 1 tablet (10 mg total) by mouth daily.   cetirizine (ZYRTEC) 10 MG tablet Take 1 tablet by mouth daily as needed for allergies.   Cholecalciferol 1.25 MG (50000 UT) capsule Take 1 capsule every week by oral route.   dicyclomine (BENTYL) 20 MG tablet Take 20 mg by mouth every 6 (six) hours.   diphenoxylate-atropine (LOMOTIL) 2.5-0.025 MG tablet Take 1 tablet by mouth 4 (four) times daily as needed for diarrhea or loose stools.   fluticasone (FLONASE) 50 MCG/ACT nasal spray Spray 2 sprays every day by intranasal route.   hydrocortisone 2.5 % cream apply rectally twice daily   hydrOXYzine (ATARAX) 25 MG tablet Take 1 tablet by mouth in the morning and at bedtime.   omeprazole (PRILOSEC) 20 MG capsule Take 20 mg by mouth daily.   ondansetron  (ZOFRAN -ODT) 4 MG disintegrating tablet Take by mouth.   Simethicone 125 MG CAPS Take 1 capsule every day by oral route as needed.   sulfaSALAzine (AZULFIDINE) 500 MG tablet Take 1,000 mg by mouth 3 (three) times daily.   telmisartan (MICARDIS) 80 MG tablet Take 1 tablet by mouth daily.   triamcinolone cream (KENALOG) 0.1 % Apply 1 application topically 2 (two) times daily.   [DISCONTINUED] amLODipine (NORVASC) 5 MG tablet Take 5 mg by mouth daily.   [DISCONTINUED] dicyclomine (BENTYL) 10 MG capsule Take 10 mg by mouth every 6 (six) hours as needed for spasms.   [DISCONTINUED] HYDROcodone -acetaminophen  (NORCO/VICODIN) 5-325 MG tablet Take one tab po q 4 hrs prn pain   [DISCONTINUED] potassium chloride  SA (K-DUR,KLOR-CON ) 20 MEQ tablet Take 1 tablet (20 mEq total) by mouth daily.   [DISCONTINUED] predniSONE  (DELTASONE ) 20 MG tablet 2 tabs po daily x 4  days   predniSONE  (DELTASONE ) 20 MG tablet 2 tabs po daily x 4 days   Facility-Administered Encounter Medications as of 07/21/2024  Medication   promethazine  (PHENERGAN ) tablet 25 mg    Allergies  Allergen Reactions   Aspirin Other (See Comments)    Colon disturbance per patient   Ketorolac  Tromethamine  Itching    Review of Systems  Constitutional:  Negative for fever.  HENT:  Negative for congestion and sore throat.   Gastrointestinal:  Positive for diarrhea and nausea.  Skin:  Negative for itching and rash.  Neurological:  Negative for dizziness and headaches.         Objective:  BP (!) 178/89  Pulse 80   Temp 98.9 F (37.2 C)   Ht 5' 10 (1.778 m)   Wt 158 lb 12.8 oz (72 kg)   SpO2 96%   BMI 22.79 kg/m    Wt Readings from Last 3 Encounters:  07/21/24 158 lb 12.8 oz (72 kg)  10/10/21 170 lb (77.1 kg)  10/20/16 175 lb (79.4 kg)   BP Readings from Last 3 Encounters:  07/21/24 (!) 178/89  10/10/21 (!) 181/111  10/20/16 171/82     Physical Exam Vitals and nursing note reviewed.  Constitutional:      Appearance: Normal appearance.  HENT:     Head: Normocephalic and atraumatic.     Right Ear: Tympanic membrane, ear canal and external ear normal. There is no impacted cerumen.     Left Ear: Ear canal and external ear normal. There is no impacted cerumen.     Nose: Nose normal.  Eyes:     Extraocular Movements: Extraocular movements intact.     Conjunctiva/sclera: Conjunctivae normal.     Pupils: Pupils are equal, round, and reactive to light.  Neck:     Vascular: No carotid bruit.  Cardiovascular:     Heart sounds: Normal heart sounds.  Pulmonary:     Effort: Pulmonary effort is normal.     Breath sounds: Normal breath sounds.  Abdominal:     General: Abdomen is flat. Bowel sounds are increased. There is no abdominal bruit.     Palpations: Abdomen is soft.     Tenderness: There is no abdominal tenderness.  Musculoskeletal:        General: Normal range  of motion.     Cervical back: Normal range of motion and neck supple. No rigidity or tenderness.     Right lower leg: No edema.     Left lower leg: No edema.  Lymphadenopathy:     Cervical: No cervical adenopathy.  Skin:    General: Skin is warm and dry.     Findings: No rash.  Neurological:     Mental Status: He is alert and oriented to person, place, and time.  Psychiatric:        Mood and Affect: Mood normal.        Behavior: Behavior normal.        Thought Content: Thought content normal.        Judgment: Judgment normal.    Physical Exam VITALS: BP- 178/89 ABDOMEN: Normal bowel sounds.     Results for orders placed or performed during the hospital encounter of 10/10/21  Comprehensive metabolic panel   Collection Time: 10/10/21 10:03 AM  Result Value Ref Range   Sodium 134 (L) 135 - 145 mmol/L   Potassium 3.5 3.5 - 5.1 mmol/L   Chloride 99 98 - 111 mmol/L   CO2 26 22 - 32 mmol/L   Glucose, Bld 101 (H) 70 - 99 mg/dL   BUN 6 (L) 8 - 23 mg/dL   Creatinine, Ser 9.19 0.61 - 1.24 mg/dL   Calcium 9.1 8.9 - 89.6 mg/dL   Total Protein 7.4 6.5 - 8.1 g/dL   Albumin 4.4 3.5 - 5.0 g/dL   AST 34 15 - 41 U/L   ALT 25 0 - 44 U/L   Alkaline Phosphatase 60 38 - 126 U/L   Total Bilirubin 0.5 0.3 - 1.2 mg/dL   GFR, Estimated >39 >39 mL/min   Anion gap 9 5 - 15  CBC   Collection Time: 10/10/21 10:03 AM  Result Value Ref Range  WBC 9.5 4.0 - 10.5 K/uL   RBC 4.60 4.22 - 5.81 MIL/uL   Hemoglobin 15.8 13.0 - 17.0 g/dL   HCT 54.5 60.9 - 47.9 %   MCV 98.7 80.0 - 100.0 fL   MCH 34.3 (H) 26.0 - 34.0 pg   MCHC 34.8 30.0 - 36.0 g/dL   RDW 86.7 88.4 - 84.4 %   Platelets 327 150 - 400 K/uL   nRBC 0.0 0.0 - 0.2 %  Type and screen [facility]   Collection Time: 10/10/21 10:05 AM  Result Value Ref Range   ABO/RH(D) O POS    Antibody Screen NEG    Sample Expiration      10/13/2021,2359 Performed at [facility], [address] [place], [place] [address]   Lipase, blood    Collection Time: 10/10/21 10:06 AM  Result Value Ref Range   Lipase 23 11 - 51 U/L       Pertinent labs & imaging results that were available during my care of the patient were reviewed by me and considered in my medical decision making.  Assessment & Plan:  Elimelech was seen today for new patient (initial visit).  Diagnoses and all orders for this visit:  Encounter for general adult medical examination with abnormal findings -     CBC with Differential/Platelet -     CMP14+EGFR -     Hepatitis C antibody -     Thyroid  Panel With TSH -     PSA, total and free -     Lipid panel -     amLODipine  (NORVASC ) 10 MG tablet; Take 1 tablet (10 mg total) by mouth daily. -     promethazine  (PHENERGAN ) tablet 25 mg  Ulcerative pancolitis without complication (HCC) -     Ambulatory referral to Gastroenterology -     predniSONE  (DELTASONE ) 20 MG tablet; 2 tabs po daily x 4 days -     promethazine  (PHENERGAN ) tablet 25 mg     Assessment and Plan Lillian is a 68 yrs old Caucasian male seen today to establish care, no acute distress Assessment & Plan Adult Wellness Visit Discussed importance of annual wellness exams for Medicare. - Will schedule annual wellness exam.  Ulcerative pancolitis, currently active Active flare-up with diarrhea, nausea, and abdominal pain. Under GI care with pending colonoscopy. Discussed dietary modifications. - Referred to GI specialist for urgent evaluation. - Prescribed prednisone  2 tablets daily for 4 days. - Administered promethazine  injection for nausea. - Advised bland diet with non-greasy foods such as bananas, white rice, and plenty of fluids until diarrhea subsides.  Uncontrolled hypertension Blood pressure 178/89 mmHg. Non-adherence to medication. Discussed increasing amlodipine  and adherence. Consider referral to hypertension clinic if uncontrolled. - Increased amlodipine  to 10 mg daily. - Advised taking medication daily, even without food. -  Scheduled follow-up in 2 weeks for blood pressure check. - Will consider referral to hypertension clinic if blood pressure remains uncontrolled.  Hemorrhoids Presence of hemorrhoids.  Anemia Diagnosis of anemia. Not taking iron supplements.  Insomnia Reports poor sleep quality with 5 hours of sleep per night. Discussed impact on health.  Knee pain Reports knee pain.  Ankle pain Reports ankle pain.   Labs: CBC, CMP, lipid, TSH, PSA result pending  Continue all other maintenance medications.  Follow up plan: Return in about 2 weeks (around 08/04/2024) for HTN check.   Continue healthy lifestyle choices, including diet (rich in fruits, vegetables, and lean proteins, and low in salt and simple carbohydrates) and  exercise (at least 30 minutes of moderate physical activity daily).  Educational handout given for    Clinical References  Health Maintenance, Male Adopting a healthy lifestyle and getting preventive care are important in promoting health and wellness. Ask your health care provider about: The right schedule for you to have regular tests and exams. Things you can do on your own to prevent diseases and keep yourself healthy. What should I know about diet, weight, and exercise? Eat a healthy diet  Eat a diet that includes plenty of vegetables, fruits, low-fat dairy products, and lean protein. Do not eat a lot of foods that are high in solid fats, added sugars, or sodium. Maintain a healthy weight Body mass index (BMI) is a measurement that can be used to identify possible weight problems. It estimates body fat based on height and weight. Your health care provider can help determine your BMI and help you achieve or maintain a healthy weight. Get regular exercise Get regular exercise. This is one of the most important things you can do for your health. Most adults should: Exercise for at least 150 minutes each week. The exercise should increase your heart rate and make  you sweat (moderate-intensity exercise). Do strengthening exercises at least twice a week. This is in addition to the moderate-intensity exercise. Spend less time sitting. Even light physical activity can be beneficial. Watch cholesterol and blood lipids Have your blood tested for lipids and cholesterol at 68 years of age, then have this test every 5 years. You may need to have your cholesterol levels checked more often if: Your lipid or cholesterol levels are high. You are older than 68 years of age. You are at high risk for heart disease. What should I know about cancer screening? Many types of cancers can be detected early and may often be prevented. Depending on your health history and family history, you may need to have cancer screening at various ages. This may include screening for: Colorectal cancer. Prostate cancer. Skin cancer. Lung cancer. What should I know about heart disease, diabetes, and high blood pressure? Blood pressure and heart disease High blood pressure causes heart disease and increases the risk of stroke. This is more likely to develop in people who have high blood pressure readings or are overweight. Talk with your health care provider about your target blood pressure readings. Have your blood pressure checked: Every 3-5 years if you are 52-61 years of age. Every year if you are 42 years old or older. If you are between the ages of 38 and 52 and are a current or former smoker, ask your health care provider if you should have a one-time screening for abdominal aortic aneurysm (AAA). Diabetes Have regular diabetes screenings. This checks your fasting blood sugar level. Have the screening done: Once every three years after age 12 if you are at a normal weight and have a low risk for diabetes. More often and at a younger age if you are overweight or have a high risk for diabetes. What should I know about preventing infection? Hepatitis B If you have a higher risk  for hepatitis B, you should be screened for this virus. Talk with your health care provider to find out if you are at risk for hepatitis B infection. Hepatitis C Blood testing is recommended for: Everyone born from [birth year] through 1965. Anyone with known risk factors for hepatitis C. Sexually transmitted infections (STIs) You should be screened each year for STIs, including gonorrhea and chlamydia, if:  You are sexually active and are younger than 68 years of age. You are older than 68 years of age and your health care provider tells you that you are at risk for this type of infection. Your sexual activity has changed since you were last screened, and you are at increased risk for chlamydia or gonorrhea. Ask your health care provider if you are at risk. Ask your health care provider about whether you are at high risk for HIV. Your health care provider may recommend a prescription medicine to help prevent HIV infection. If you choose to take medicine to prevent HIV, you should first get tested for HIV. You should then be tested every 3 months for as long as you are taking the medicine. Follow these instructions at home: Alcohol use Do not drink alcohol if your health care provider tells you not to drink. If you drink alcohol: Limit how much you have to 0-2 drinks a day. Know how much alcohol is in your drink. In the [place] one drink equals one 12 oz bottle of beer (355 mL), one 5 oz glass of wine (148 mL), or one 1 oz glass of hard liquor (44 mL). Lifestyle Do not use any products that contain nicotine or tobacco. These products include cigarettes, chewing tobacco, and vaping devices, such as e-cigarettes. If you need help quitting, ask your health care provider. Do not use street drugs. Do not share needles. Ask your health care provider for help if you need support or information about quitting drugs. General instructions Schedule regular health, dental, and eye exams. Stay current with your  vaccines. Tell your health care provider if: You often feel depressed. You have ever been abused or do not feel safe at home. Summary Adopting a healthy lifestyle and getting preventive care are important in promoting health and wellness. Follow your health care provider's instructions about healthy diet, exercising, and getting tested or screened for diseases. Follow your health care provider's instructions on monitoring your cholesterol and blood pressure. This information is not intended to replace advice given to you by your health care provider. Make sure you discuss any questions you have with your health care provider. Document Revised: 12/26/2020 Document Reviewed: 12/26/2020 Elsevier Patient Education  2024 [employer] Chronic Inflammation of the Colon and Rectum in Adults (Ulcerative Colitis): What to Know  Ulcerative colitis is long-term (chronic) inflammation of the large intestine (colon) and rectum. Sores, or ulcers, may also form in these areas. Ulcerative colitis, along with a closely related condition called Crohn's disease, is often referred to as inflammatory bowel disease. What are the causes? This condition may be caused by increased activity of the immune system in the intestines. The immune system protects the body against germs, such as bacteria or viruses. The cause of the increased activity of the immune system is not known. What increases the risk? You're more likely to have this condition if: You're under 50 years old. You have family members who have ulcerative colitis. What are the signs or symptoms? Symptoms vary depending on how serious the condition is. Symptoms may come and go. Common symptoms include: Bleeding in the rectum. Watery poop. This may have blood or pus. Other symptoms can include: Pain or cramping in the belly. Fever. Tiredness. Feeling like you may throw up. Weight loss and having no desire for food. Pain in the rectum. A strong and  sudden need to poop. Lack of red blood cells (anemia). Yellowing of the skin (jaundice). This comes from liver  problems or skin rashes. How is this diagnosed? This condition may be diagnosed based on: Your symptoms and medical history. A physical exam. Tests, including blood and stool tests, X-rays, CT scan, or MRI. You may also have: Colonoscopy. This checks the colon for problems. Biopsy. A small piece of tissue is taken from the colon and checked in a lab. How is this treated? There is no cure for this condition, but it can be managed. Treatment depends on how serious your condition is.  To treat this condition, you'll take medicines to: Treat swelling and inflammation. Control your immune system. Treat infections. Relieve pain. Treat diarrhea. If you have very serious ulcerative colitis, you may be treated in the hospital. In the hospital: You'll be put on bowel rest. This means you'll not eat or drink for a period of time. You'll get medicines through an IV. You'll get fluids and nutrition through an IV, or through an NG tube. An NG tube is passed through your nose and into your stomach. Surgery to remove the affected part of the colon. This may be done if other treatments are not working. This condition increases the risk for colon cancer. Adults with this condition will need to be checked for colon cancer throughout life. Follow these instructions at home: Medicines and vitamins Take your medicines only as told. Do not take aspirin. If you were given antibiotics, take them as told. Do not stop taking them even if you start to feel better. Ask your health care provider if you should take any vitamins or supplements. You may need to take: Calcium and vitamin D for bone health. Iron to help treat anemia. Lifestyle Exercise as told. Work with your provider to manage your condition and to learn more about it. Do not smoke, vape, or use nicotine or tobacco. If you drink  alcohol: Limit how much you have to: 0-1 drink a day if you're male. 0-2 drinks a day if you're male. Know how much alcohol is in your drink. In the [place] one drink is one 12 oz bottle of beer (355 mL), one 5 oz glass of wine (148 mL), or one 1 oz glass of hard liquor (44 mL). Eating and drinking Keep a food diary. This may help you identify and avoid any foods that causes your symptoms. Drink more fluids as told. Follow a well-balanced diet as told by your provider. This may include: Avoiding carbonated drinks. Avoiding popcorn, vegetable skins, nuts, and other high-fiber foods. Avoiding foods that are high in fat. Eating smaller meals, but more often. Limiting sugary drinks. Limiting caffeine. Follow food safety recommendations as told by your provider. This may include making sure you: Avoid eating raw or undercooked meat, fish, or eggs. Do not eat or drink spoiled or expired foods and drinks. General instructions Wash your hands often with soap and water for at least 20 seconds. If soap and water are not available, use hand sanitizer. Stay up to date on your vaccines, including your flu shot every year. Ask your provider which vaccines you should get. Have cancer screening tests as told. Ulcerative colitis may place you at increased risk for colon cancer. Keep all follow-up visits. Your provider will need to monitor your treatment. Where to find more information For more information: Visit [email or web address].ee Search for ulcerative colitis Contact a health care provider if: Your symptoms do not get better or they get worse with treatment. You continue to lose weight. You have cramps or loose poops, and the symptoms do  not go away. You get a new skin rash, skin sores, or eye problems. You have a fever or chills. Get help right away if: You have blood in your poop. You bleed a lot from the rectum. You feel that your heart is racing. You have very bad pain in your  belly. Your belly swells. Your belly is tender to the touch. You throw up. This information is not intended to replace advice given to you by your health care provider. Make sure you discuss any questions you have with your health care provider. Document Revised: 04/02/2023 Document Reviewed: 04/02/2023 Elsevier Patient Education  2025 [employer]. Atherosclerosis  Atherosclerosis is when plaque builds up in the arteries. This causes narrowing and hardening of the arteries. Arteries are blood vessels that carry blood from the heart to all parts of the body. This blood contains oxygen. Plaque occurs due to inflammation or from a buildup of fat, cholesterol, calcium, waste products of cells, and a clotting material in the blood (fibrin). Plaque decreases the amount of blood that can flow through the artery. Atherosclerosis can affect any artery in your body, including: Heart arteries. Damage to these arteries may lead to coronary artery disease, which can cause a heart attack. Brain arteries. Damage to these arteries may cause a stroke. Leg, arm, and pelvis arteries. Peripheral artery disease (PAD) may result from damage to these arteries. Kidney arteries. Kidney (renal) failure may result from damage to kidney arteries. Treatment may slow the disease and prevent further damage to your heart, brain, peripheral arteries, and kidneys. What are the causes? This condition develops slowly over many years. The inner layers of your arteries become damaged and allow the gradual buildup of plaque. The exact cause of atherosclerosis is not fully understood. Symptoms of atherosclerosis do not occur until an artery becomes narrow or blocked. What increases the risk? The following factors may make you more likely to develop this condition: Being middle-aged or older. Certain medical conditions, including: High blood pressure. High cholesterol. High blood fats (triglycerides). Diabetes. Sleep  apnea. Obesity. Certain lab levels, including: Elevated C-reactive protein (CRP). This is a sign of increased inflammation in your body. Elevated homocysteine levels. This is an amino acid that is associated with heart and blood vessel disease. Using tobacco or nicotine products. A family history of atherosclerosis. Not exercising enough (sedentary lifestyle). Being stressed. Drinking too much alcohol or using drugs, such as cocaine or methamphetamine. What are the signs or symptoms? Symptoms of atherosclerosis do not occur until the plaque severely narrows or blocks the artery, which decreases blood flow. Sometimes, atherosclerosis does not cause symptoms. Symptoms of this condition include: Coronary artery disease. This may cause chest pain and shortness of breath. Decreased blood supply to your brain, which may cause a stroke. Signs of a stroke may include sudden: Weakness or numbness in your face, arm, or leg, especially on one side of your body. Trouble walking or difficulty moving your arms or legs. Loss of balance or coordination. Confusion. Slurred speech. Trouble speaking, or trouble understanding speech, or both (aphasia). Vision changes in one or both eyes. This may be double vision, blurred vision, or loss of vision. Severe headache with no known cause. The headache is often described as the worst headache ever experienced. PAD, which may cause pain, numbness, or nonhealing wounds, often in your legs and hips. Renal failure. This may cause tiredness, problems with urination, swelling, and itchy skin. How is this diagnosed? This condition is diagnosed based on your medical  history and a physical exam. During the exam, your health care provider will: Check your pulse in different places. Listen for a whooshing sound over your arteries (bruit). You may also have tests, such as: Blood tests to check your levels of cholesterol, triglycerides, blood sugar, and  CRP. Ankle-brachial index to compare blood pressure in your arms to blood pressure in your ankles to see how your blood is flowing. Heart (cardiac) tests. Electrocardiogram (ECG) to check for heart damage. Stress test to see how your heart reacts to exercise. Ultrasound tests. Ultrasound of your peripheral arteries to check blood flow. Echocardiogram to get images of your heart's chambers and valves. X-ray tests. Chest X-ray to see if you have an enlarged heart, which is a sign of heart failure. CT scan to check for damage to your heart, brain, or arteries. Angiogram. This is a test where dye is injected and X-rays are used to see the blood flow in the arteries. How is this treated? This condition is treated with lifestyle changes as the first step. These may include: Changing your diet. Losing weight. Reducing stress. Exercising and being physically active more regularly. Quitting smoking. You may also need medicine to: Lower triglycerides and cholesterol. Control blood pressure. Prevent blood clots. Lower inflammation in your body. Control your blood sugar. Sometimes, surgery is needed to: Remove plaque from an artery (endarterectomy). Open or widen a narrowed heart artery or peripheral artery (angioplasty). Create a new path for your blood with one of these procedures: Heart (coronary) artery bypass graft surgery. Peripheral artery bypass graft surgery. Place a small mesh tube (stent) in an artery to open or widen a narrowed artery. Follow these instructions at home: Eating and drinking  Eat a heart-healthy diet. Talk with your health care provider or a dietitian if you need help. A heart-healthy diet involves: Limiting unhealthy fats and increasing healthy fats. Some examples of healthy fats are avocados and olive oil. Eating plant-based foods, such as fruits, vegetables, nuts, whole grains, and legumes (such as peas and lentils). If you drink alcohol: Limit how much you  have to: 0-1 drink a day for women who are not pregnant. 0-2 drinks a day for men. Know how much alcohol is in a drink. In the [place] one drink equals one 12 oz bottle of beer (355 mL), one 5 oz glass of wine (148 mL), or one 1 oz glass of hard liquor (44 mL). Lifestyle  Maintain a healthy weight. Lose weight if your health care provider says that you need to do that. Follow an exercise program as told by your health care provider. Do not use any products that contain nicotine or tobacco. These products include cigarettes, chewing tobacco, and vaping devices, such as e-cigarettes. If you need help quitting, ask your health care provider. Do not use drugs. General instructions Take over-the-counter and prescription medicines only as told by your health care provider. Manage other health conditions as told. Keep all follow-up visits. This is important. Contact a health care provider if you have: An irregular heartbeat. Unexplained tiredness (fatigue). Trouble urinating, or you are producing less urine or foamy urine. Swelling of your hands or feet, or itchy skin. Unexplained pain or numbness in your legs or hips. A wound that is slow to heal or is not healing. Get help right away if: You have any symptoms of a heart attack. These may be: Chest pain. This includes squeezing chest pain that may feel like indigestion (angina). Shortness of breath. Pain in your neck,  jaw, arms, back, or stomach. Cold sweat. Nausea. Light-headedness. Sudden pain, numbness, or coldness in a limb. You have any symptoms of a stroke. BE FAST is an easy way to remember the main warning signs of a stroke: B - Balance. Signs are dizziness, sudden trouble walking, or loss of balance. E - Eyes. Signs are trouble seeing or a sudden change in vision. F - Face. Signs are sudden weakness or numbness of the face, or the face or eyelid drooping on one side. A - Arms. Signs are weakness or numbness in an arm. This  happens suddenly and usually on one side of the body. S - Speech. Signs are sudden trouble speaking, slurred speech, or trouble understanding what people say. T - Time. Time to call emergency services. Write down what time symptoms started. You have other signs of a stroke, such as: A sudden, severe headache with no known cause. Nausea or vomiting. Seizure. These symptoms may represent a serious problem that is an emergency. Do not wait to see if the symptoms will go away. Get medical help right away. Call your local emergency services (911 in the [place] Do not drive yourself to the hospital. Summary Atherosclerosis is when plaque builds up in the arteries and causes narrowing and hardening of the arteries. Plaque occurs due to inflammation or from a buildup of fat, cholesterol, calcium, cellular waste products, and fibrin. This condition may not cause any symptoms. Symptoms of atherosclerosis do not occur until the plaque severely narrows or blocks the artery. Treatment starts with lifestyle changes and may include medicines. In some cases, surgery is needed. Get help right away if you have any symptoms of a heart attack or stroke. This information is not intended to replace advice given to you by your health care provider. Make sure you discuss any questions you have with your health care provider. Document Revised: 11/09/2020 Document Reviewed: 11/09/2020 Elsevier Patient Education  2024 [employer] Hypertension, Adult Hypertension is another name for high blood pressure. High blood pressure forces your heart to work harder to pump blood. This can cause problems over time. There are two numbers in a blood pressure reading. There is a top number (systolic) over a bottom number (diastolic). It is best to have a blood pressure that is below 120/80. What are the causes? The cause of this condition is not known. Some other conditions can lead to high blood pressure. What increases the  risk? Some lifestyle factors can make you more likely to develop high blood pressure: Smoking. Not getting enough exercise or physical activity. Being overweight. Having too much fat, sugar, calories, or salt (sodium) in your diet. Drinking too much alcohol. Other risk factors include: Having any of these conditions: Heart disease. Diabetes. High cholesterol. Kidney disease. Obstructive sleep apnea. Having a family history of high blood pressure and high cholesterol. Age. The risk increases with age. Stress. What are the signs or symptoms? High blood pressure may not cause symptoms. Very high blood pressure (hypertensive crisis) may cause: Headache. Fast or uneven heartbeats (palpitations). Shortness of breath. Nosebleed. Vomiting or feeling like you may vomit (nauseous). Changes in how you see. Very bad chest pain. Feeling dizzy. Seizures. How is this treated? This condition is treated by making healthy lifestyle changes, such as: Eating healthy foods. Exercising more. Drinking less alcohol. Your doctor may prescribe medicine if lifestyle changes do not help enough and if: Your top number is above 130. Your bottom number is above 80. Your personal target blood pressure  may vary. Follow these instructions at home: Eating and drinking  If told, follow the DASH eating plan. To follow this plan: Fill one half of your plate at each meal with fruits and vegetables. Fill one fourth of your plate at each meal with whole grains. Whole grains include whole-wheat pasta, brown rice, and whole-grain bread. Eat or drink low-fat dairy products, such as skim milk or low-fat yogurt. Fill one fourth of your plate at each meal with low-fat (lean) proteins. Low-fat proteins include fish, chicken without skin, eggs, beans, and tofu. Avoid fatty meat, cured and processed meat, or chicken with skin. Avoid pre-made or processed food. Limit the amount of salt in your diet to less than 1,500 mg  each day. Do not drink alcohol if: Your doctor tells you not to drink. You are pregnant, may be pregnant, or are planning to become pregnant. If you drink alcohol: Limit how much you have to: 0-1 drink a day for women. 0-2 drinks a day for men. Know how much alcohol is in your drink. In the [place] one drink equals one 12 oz bottle of beer (355 mL), one 5 oz glass of wine (148 mL), or one 1 oz glass of hard liquor (44 mL). Lifestyle  Work with your doctor to stay at a healthy weight or to lose weight. Ask your doctor what the best weight is for you. Get at least 30 minutes of exercise that causes your heart to beat faster (aerobic exercise) most days of the week. This may include walking, swimming, or biking. Get at least 30 minutes of exercise that strengthens your muscles (resistance exercise) at least 3 days a week. This may include lifting weights or doing Pilates. Do not smoke or use any products that contain nicotine or tobacco. If you need help quitting, ask your doctor. Check your blood pressure at home as told by your doctor. Keep all follow-up visits. Medicines Take over-the-counter and prescription medicines only as told by your doctor. Follow directions carefully. Do not skip doses of blood pressure medicine. The medicine does not work as well if you skip doses. Skipping doses also puts you at risk for problems. Ask your doctor about side effects or reactions to medicines that you should watch for. Contact a doctor if: You think you are having a reaction to the medicine you are taking. You have headaches that keep coming back. You feel dizzy. You have swelling in your ankles. You have trouble with your vision. Get help right away if: You get a very bad headache. You start to feel mixed up (confused). You feel weak or numb. You feel faint. You have very bad pain in your: Chest. Belly (abdomen). You vomit more than once. You have trouble breathing. These symptoms may be  an emergency. Get help right away. Call 911. Do not wait to see if the symptoms will go away. Do not drive yourself to the hospital. Summary Hypertension is another name for high blood pressure. High blood pressure forces your heart to work harder to pump blood. For most people, a normal blood pressure is less than 120/80. Making healthy choices can help lower blood pressure. If your blood pressure does not get lower with healthy choices, you may need to take medicine. This information is not intended to replace advice given to you by your health care provider. Make sure you discuss any questions you have with your health care provider. Document Revised: 05/25/2021 Document Reviewed: 05/25/2021 Elsevier Patient Education  2024 [employer].  Generalized Anxiety Disorder, Adult Generalized anxiety disorder (GAD) is a mental health condition. Unlike normal worries, anxiety related to GAD is not triggered by a specific event. These worries do not fade or get better with time. GAD interferes with relationships, work, and school. GAD symptoms can vary from mild to severe. People with severe GAD can have intense waves of anxiety with physical symptoms that are similar to panic attacks. What are the causes? The exact cause of GAD is not known, but the following are believed to have an impact: Differences in natural brain chemicals. Genes passed down from parents to children. Differences in the way threats are perceived. Development and stress during childhood. Personality. What increases the risk? The following factors may make you more likely to develop this condition: Being male. Having a family history of anxiety disorders. Being very shy. Experiencing very stressful life events, such as the death of a loved one. Having a very stressful family environment. What are the signs or symptoms? People with GAD often worry excessively about many things in their lives, such as their health and  family. Symptoms may also include: Mental and emotional symptoms: Worrying excessively about natural disasters. Fear of being late. Difficulty concentrating. Fears that others are judging your performance. Physical symptoms: Fatigue. Headaches, muscle tension, muscle twitches, trembling, or feeling shaky. Feeling like your heart is pounding or beating very fast. Feeling out of breath or like you cannot take a deep breath. Having trouble falling asleep or staying asleep, or experiencing restlessness. Sweating. Nausea, diarrhea, or irritable bowel syndrome (IBS). Behavioral symptoms: Experiencing erratic moods or irritability. Avoidance of new situations. Avoidance of people. Extreme difficulty making decisions. How is this diagnosed? This condition is diagnosed based on your symptoms and medical history. You will also have a physical exam. Your health care provider may perform tests to rule out other possible causes of your symptoms. To be diagnosed with GAD, a person must have anxiety that: Is out of his or her control. Affects several different aspects of his or her life, such as work and relationships. Causes distress that makes him or her unable to take part in normal activities. Includes at least three symptoms of GAD, such as restlessness, fatigue, trouble concentrating, irritability, muscle tension, or sleep problems. Before your health care provider can confirm a diagnosis of GAD, these symptoms must be present more days than they are not, and they must last for 6 months or longer. How is this treated? This condition may be treated with: Medicine. Antidepressant medicine is usually prescribed for long-term daily control. Anti-anxiety medicines may be added in severe cases, especially when panic attacks occur. Talk therapy (psychotherapy). Certain types of talk therapy can be helpful in treating GAD by providing support, education, and guidance. Options include: Cognitive  behavioral therapy (CBT). People learn coping skills and self-calming techniques to ease their physical symptoms. They learn to identify unrealistic thoughts and behaviors and to replace them with more appropriate thoughts and behaviors. Acceptance and commitment therapy (ACT). This treatment teaches people how to be mindful as a way to cope with unwanted thoughts and feelings. Biofeedback. This process trains you to manage your body's response (physiological response) through breathing techniques and relaxation methods. You will work with a therapist while machines are used to monitor your physical symptoms. Stress management techniques. These include yoga, meditation, and exercise. A mental health specialist can help determine which treatment is best for you. Some people see improvement with one type of therapy. However, other people require  a combination of therapies. Follow these instructions at home: Lifestyle Maintain a consistent routine and schedule. Anticipate stressful situations. Create a plan and allow extra time to work with your plan. Practice stress management or self-calming techniques that you have learned from your therapist or your health care provider. Exercise regularly and spend time outdoors. Eat a healthy diet that includes plenty of vegetables, fruits, whole grains, low-fat dairy products, and lean protein. Do not eat a lot of foods that are high in fat, added sugar, or salt (sodium). Drink plenty of water. Avoid alcohol. Alcohol can increase anxiety. Avoid caffeine and certain over-the-counter cold medicines. These may make you feel worse. Ask your pharmacist which medicines to avoid. General instructions Take over-the-counter and prescription medicines only as told by your health care provider. Understand that you are likely to have setbacks. Accept this and be kind to yourself as you persist to take better care of yourself. Anticipate stressful situations. Create a plan  and allow extra time to work with your plan. Recognize and accept your accomplishments, even if you judge them as small. Spend time with people who care about you. Keep all follow-up visits. This is important. Where to find more information [employer] of Mental [employer]: http://www.maynard.net/ Substance Abuse and Mental Health Services: skateoasis.com.pt Contact a health care provider if: Your symptoms do not get better. Your symptoms get worse. You have signs of depression, such as: A persistently sad or irritable mood. Loss of enjoyment in activities that used to bring you joy. Change in weight or eating. Changes in sleeping habits. Get help right away if: You have thoughts about hurting yourself or others. If you ever feel like you may hurt yourself or others, or have thoughts about taking your own life, get help right away. Go to your nearest emergency department or: Call your local emergency services (911 in the [place] Call a suicide crisis helpline, such as the National Suicide Prevention Lifeline at 514-636-0607 or 988 in the [place] This is open 24 hours a day in the [place] If you're a Veteran: Call 988 and press 1. This is open 24 hours a day. Text the [employer] at 603 292 4571. Summary Generalized anxiety disorder (GAD) is a mental health condition that involves worry that is not triggered by a specific event. People with GAD often worry excessively about many things in their lives, such as their health and family. GAD may cause symptoms such as restlessness, trouble concentrating, sleep problems, frequent sweating, nausea, diarrhea, headaches, and trembling or muscle twitching. A mental health specialist can help determine which treatment is best for you. Some people see improvement with one type of therapy. However, other people require a combination of therapies. This information is not intended to replace advice given to you by your health care provider. Make sure you discuss  any questions you have with your health care provider. Document Revised: 03/21/2023 Document Reviewed: 11/27/2020 Elsevier Patient Education  2024 [employer]vier Inc.  The above assessment and management plan was discussed with the patient. The patient verbalized understanding of and has agreed to the management plan. Patient is aware to call the clinic if they develop any new symptoms or if symptoms persist or worsen. Patient is aware when to return to the clinic for a follow-up visit. Patient educated on when it is appropriate to go to the emergency department.   Jacob Cicero [place] Thompson, DNP [facility] [address] [place], [place] [address] [phone]

## 2024-07-21 NOTE — Addendum Note (Signed)
 Addended by: DEITRA MORTON SEBASTIAN NENA on: 07/21/2024 04:37 PM   Modules accepted: Level of Service

## 2024-07-21 NOTE — Addendum Note (Signed)
 Addended by: MILAS KENT D on: 07/21/2024 05:47 PM   Modules accepted: Orders

## 2024-07-22 ENCOUNTER — Ambulatory Visit: Payer: Self-pay | Admitting: Nurse Practitioner

## 2024-07-22 LAB — PSA, TOTAL AND FREE
PSA, Free Pct: 25 %
PSA, Free: 0.2 ng/mL
Prostate Specific Ag, Serum: 0.8 ng/mL (ref 0.0–4.0)

## 2024-07-22 LAB — CBC WITH DIFFERENTIAL/PLATELET
Basophils Absolute: 0.1 x10E3/uL (ref 0.0–0.2)
Basos: 1 %
EOS (ABSOLUTE): 0.1 x10E3/uL (ref 0.0–0.4)
Eos: 1 %
Hematocrit: 43.3 % (ref 37.5–51.0)
Hemoglobin: 14.7 g/dL (ref 13.0–17.7)
Immature Grans (Abs): 0 x10E3/uL (ref 0.0–0.1)
Immature Granulocytes: 0 %
Lymphocytes Absolute: 1.1 x10E3/uL (ref 0.7–3.1)
Lymphs: 14 %
MCH: 33.9 pg — ABNORMAL HIGH (ref 26.6–33.0)
MCHC: 33.9 g/dL (ref 31.5–35.7)
MCV: 100 fL — ABNORMAL HIGH (ref 79–97)
Monocytes Absolute: 1.2 x10E3/uL — ABNORMAL HIGH (ref 0.1–0.9)
Monocytes: 16 %
Neutrophils Absolute: 5.1 x10E3/uL (ref 1.4–7.0)
Neutrophils: 68 %
Platelets: 311 x10E3/uL (ref 150–450)
RBC: 4.33 x10E6/uL (ref 4.14–5.80)
RDW: 13.3 % (ref 11.6–15.4)
WBC: 7.5 x10E3/uL (ref 3.4–10.8)

## 2024-07-22 LAB — LIPID PANEL
Chol/HDL Ratio: 2 ratio (ref 0.0–5.0)
Cholesterol, Total: 188 mg/dL (ref 100–199)
HDL: 93 mg/dL (ref >39)
LDL Chol Calc (NIH): 78 mg/dL (ref 0–99)
Triglycerides: 97 mg/dL (ref 0–149)
VLDL Cholesterol Cal: 17 mg/dL (ref 5–40)

## 2024-07-22 LAB — THYROID PANEL WITH TSH
Free Thyroxine Index: 1.8 (ref 1.2–4.9)
T3 Uptake Ratio: 30 % (ref 24–39)
T4, Total: 5.9 ug/dL (ref 4.5–12.0)
TSH: 3.04 u[IU]/mL (ref 0.450–4.500)

## 2024-07-22 LAB — CMP14+EGFR
ALT: 23 IU/L (ref 0–44)
AST: 32 IU/L (ref 0–40)
Albumin: 4.4 g/dL (ref 3.9–4.9)
Alkaline Phosphatase: 71 IU/L (ref 47–123)
BUN/Creatinine Ratio: 7 — ABNORMAL LOW (ref 10–24)
BUN: 5 mg/dL — ABNORMAL LOW (ref 8–27)
Bilirubin Total: 0.4 mg/dL (ref 0.0–1.2)
CO2: 24 mmol/L (ref 20–29)
Calcium: 9.1 mg/dL (ref 8.6–10.2)
Chloride: 97 mmol/L (ref 96–106)
Creatinine, Ser: 0.72 mg/dL — ABNORMAL LOW (ref 0.76–1.27)
Globulin, Total: 2.5 g/dL (ref 1.5–4.5)
Glucose: 96 mg/dL (ref 70–99)
Potassium: 3.7 mmol/L (ref 3.5–5.2)
Sodium: 135 mmol/L (ref 134–144)
Total Protein: 6.9 g/dL (ref 6.0–8.5)
eGFR: 100 mL/min/1.73 (ref >59)

## 2024-07-22 LAB — HEPATITIS C ANTIBODY: Hep C Virus Ab: NONREACTIVE

## 2024-08-03 NOTE — Progress Notes (Deleted)
 Subjective:  Patient ID: Adrian Edwards, male    DOB: June 05, 1956, 68 y.o.   MRN: [id number]  Patient Care Team: Deitra Morton Sebastian Nena, NP as PCP - General (Nurse Practitioner)   Chief Complaint:  No chief complaint on file.   HPI: Adrian Edwards is a 68 y.o. male presenting on 08/04/2024 for No chief complaint on file.   Discussed the use of AI scribe software for clinical note transcription with the patient, who gave verbal consent to proceed.  History of Present Illness       Relevant past medical, surgical, family, and social history reviewed and updated as indicated.  Allergies and medications reviewed and updated. Data reviewed: Chart in Epic.   Past Medical History:  Diagnosis Date   Anemia    B12 deficiency    Dermatitis    Hypertension    Ulcerative colitis (HCC)     No past surgical history on file.  Social History   Socioeconomic History   Marital status: Divorced    Spouse name: Not on file   Number of children: Not on file   Years of education: Not on file   Highest education level: Not on file  Occupational History   Not on file  Tobacco Use   Smoking status: Every Day    Current packs/day: 0.50    Types: Cigarettes   Smokeless tobacco: Never  Substance and Sexual Activity   Alcohol use: No   Drug use: No   Sexual activity: Not on file  Other Topics Concern   Not on file  Social History Narrative   Not on file   Social Drivers of Health   Tobacco Use: High Risk (07/21/2024)   Patient History    Smoking Tobacco Use: Every Day    Smokeless Tobacco Use: Never    Passive Exposure: Not on file  Financial Resource Strain: High Risk (02/06/2024)   Received from [facility]   Overall Financial Resource Strain (CARDIA)    Difficulty of Paying Living Expenses: Hard  Food Insecurity: No Food Insecurity (07/21/2024)   Epic    Worried About Programme Researcher, Broadcasting/film/video in the Last Year: Never true    Ran Out of Food in the Last Year: Never true   Transportation Needs: Unmet Transportation Needs (07/21/2024)   Epic    Lack of Transportation (Medical): Yes    Lack of Transportation (Non-Medical): Yes  Physical Activity: Not on file  Stress: Not on file  Social Connections: Not on file  Intimate Partner Violence: Not At Risk (07/21/2024)   Epic    Fear of Current or Ex-Partner: No    Emotionally Abused: No    Physically Abused: No    Sexually Abused: No  Depression (PHQ2-9): Medium Risk (07/21/2024)   Depression (PHQ2-9)    PHQ-2 Score: 6  Alcohol Screen: Low Risk (07/21/2024)   Alcohol Screen    Last Alcohol Screening Score (AUDIT): 0  Housing: Low Risk (07/21/2024)   Epic    Unable to Pay for Housing in the Last Year: No    Number of Times Moved in the Last Year: 0    Homeless in the Last Year: No  Utilities: Not At Risk (07/21/2024)   Epic    Threatened with loss of utilities: No  Health Literacy: Not on file    Outpatient Encounter Medications as of 08/04/2024  Medication Sig   acetaminophen  (TYLENOL ) 500 MG tablet Take 1,000 mg by mouth every 6 (six) hours as needed.  Pain   amLODipine  (NORVASC ) 10 MG tablet Take 1 tablet (10 mg total) by mouth daily.   cetirizine (ZYRTEC) 10 MG tablet Take 1 tablet by mouth daily as needed for allergies.   Cholecalciferol 1.25 MG (50000 UT) capsule Take 1 capsule every week by oral route.   dicyclomine (BENTYL) 20 MG tablet Take 20 mg by mouth every 6 (six) hours.   diphenoxylate-atropine (LOMOTIL) 2.5-0.025 MG tablet Take 1 tablet by mouth 4 (four) times daily as needed for diarrhea or loose stools.   fluticasone (FLONASE) 50 MCG/ACT nasal spray Spray 2 sprays every day by intranasal route.   hydrocortisone 2.5 % cream apply rectally twice daily   hydrOXYzine (ATARAX) 25 MG tablet Take 1 tablet by mouth in the morning and at bedtime.   omeprazole (PRILOSEC) 20 MG capsule Take 20 mg by mouth daily.   ondansetron  (ZOFRAN -ODT) 4 MG disintegrating tablet Take by mouth.   predniSONE   (DELTASONE ) 20 MG tablet 2 tabs po daily x 4 days   Simethicone 125 MG CAPS Take 1 capsule every day by oral route as needed.   sulfaSALAzine (AZULFIDINE) 500 MG tablet Take 1,000 mg by mouth 3 (three) times daily.   telmisartan (MICARDIS) 80 MG tablet Take 1 tablet by mouth daily.   triamcinolone cream (KENALOG) 0.1 % Apply 1 application topically 2 (two) times daily.   No facility-administered encounter medications on file as of 08/04/2024.    Allergies[1]  Pertinent ROS per HPI, otherwise unremarkable      Objective:  There were no vitals taken for this visit.   Wt Readings from Last 3 Encounters:  07/21/24 158 lb 12.8 oz (72 kg)  10/10/21 170 lb (77.1 kg)  10/20/16 175 lb (79.4 kg)    Physical Exam Physical Exam      Results for orders placed or performed in visit on 07/21/24  CBC with Differential/Platelet   Collection Time: 07/21/24  4:31 PM  Result Value Ref Range   WBC 7.5 3.4 - 10.8 x10E3/uL   RBC 4.33 4.14 - 5.80 x10E6/uL   Hemoglobin 14.7 13.0 - 17.7 g/dL   Hematocrit 56.6 62.4 - 51.0 %   MCV 100 (H) 79 - 97 fL   MCH 33.9 (H) 26.6 - 33.0 pg   MCHC 33.9 31.5 - 35.7 g/dL   RDW 86.6 88.3 - 84.5 %   Platelets 311 150 - 450 x10E3/uL   Neutrophils 68 Not Estab. %   Lymphs 14 Not Estab. %   Monocytes 16 Not Estab. %   Eos 1 Not Estab. %   Basos 1 Not Estab. %   Neutrophils Absolute 5.1 1.4 - 7.0 x10E3/uL   Lymphocytes Absolute 1.1 0.7 - 3.1 x10E3/uL   Monocytes Absolute 1.2 (H) 0.1 - 0.9 x10E3/uL   EOS (ABSOLUTE) 0.1 0.0 - 0.4 x10E3/uL   Basophils Absolute 0.1 0.0 - 0.2 x10E3/uL   Immature Granulocytes 0 Not Estab. %   Immature Grans (Abs) 0.0 0.0 - 0.1 x10E3/uL  CMP14+EGFR   Collection Time: 07/21/24  4:31 PM  Result Value Ref Range   Glucose 96 70 - 99 mg/dL   BUN 5 (L) 8 - 27 mg/dL   Creatinine, Ser 9.27 (L) 0.76 - 1.27 mg/dL   eGFR 899 >40 fO/fpw/8.26   BUN/Creatinine Ratio 7 (L) 10 - 24   Sodium 135 134 - 144 mmol/L   Potassium 3.7 3.5 - 5.2  mmol/L   Chloride 97 96 - 106 mmol/L   CO2 24 20 - 29 mmol/L  Calcium 9.1 8.6 - 10.2 mg/dL   Total Protein 6.9 6.0 - 8.5 g/dL   Albumin 4.4 3.9 - 4.9 g/dL   Globulin, Total 2.5 1.5 - 4.5 g/dL   Bilirubin Total 0.4 0.0 - 1.2 mg/dL   Alkaline Phosphatase 71 47 - 123 IU/L   AST 32 0 - 40 IU/L   ALT 23 0 - 44 IU/L  Hepatitis C antibody   Collection Time: 07/21/24  4:31 PM  Result Value Ref Range   Hep C Virus Ab Non Reactive Non Reactive  Thyroid  Panel With TSH   Collection Time: 07/21/24  4:31 PM  Result Value Ref Range   TSH 3.040 0.450 - 4.500 uIU/mL   T4, Total 5.9 4.5 - 12.0 ug/dL   T3 Uptake Ratio 30 24 - 39 %   Free Thyroxine Index 1.8 1.2 - 4.9  PSA, total and free   Collection Time: 07/21/24  4:31 PM  Result Value Ref Range   Prostate Specific Ag, Serum 0.8 0.0 - 4.0 ng/mL   PSA, Free 0.20 N/A ng/mL   PSA, Free Pct 25.0 %  Lipid panel   Collection Time: 07/21/24  4:31 PM  Result Value Ref Range   Cholesterol, Total 188 100 - 199 mg/dL   Triglycerides 97 0 - 149 mg/dL   HDL 93 >60 mg/dL   VLDL Cholesterol Cal 17 5 - 40 mg/dL   LDL Chol Calc (NIH) 78 0 - 99 mg/dL   Chol/HDL Ratio 2.0 0.0 - 5.0 ratio       Pertinent labs & imaging results that were available during my care of the patient were reviewed by me and considered in my medical decision making.  Assessment & Plan:  There are no diagnoses linked to this encounter.   Assessment and Plan Assessment & Plan       Continue all other maintenance medications.  Follow up plan: No follow-ups on file.   Continue healthy lifestyle choices, including diet (rich in fruits, vegetables, and lean proteins, and low in salt and simple carbohydrates) and exercise (at least 30 minutes of moderate physical activity daily).  Educational handout given for ***  The above assessment and management plan was discussed with the patient. The patient verbalized understanding of and has agreed to the management plan. Patient  is aware to call the clinic if they develop any new symptoms or if symptoms persist or worsen. Patient is aware when to return to the clinic for a follow-up visit. Patient educated on when it is appropriate to go to the emergency department.   Nena Cassis Morton Hummer, DNP [facility] [address] [place], [place] [address] [phone]      [1]  Allergies Allergen Reactions   Aspirin Other (See Comments)    Colon disturbance per patient   Ketorolac  Tromethamine  Itching

## 2024-08-04 ENCOUNTER — Encounter (INDEPENDENT_AMBULATORY_CARE_PROVIDER_SITE_OTHER): Payer: Self-pay | Admitting: *Deleted

## 2024-08-04 ENCOUNTER — Ambulatory Visit: Admitting: Nurse Practitioner

## 2024-08-06 ENCOUNTER — Ambulatory Visit (INDEPENDENT_AMBULATORY_CARE_PROVIDER_SITE_OTHER): Admitting: Nurse Practitioner

## 2024-08-06 ENCOUNTER — Encounter: Payer: Self-pay | Admitting: Nurse Practitioner

## 2024-08-06 VITALS — BP 125/69 | HR 90 | Temp 97.9°F | Ht 70.0 in | Wt 158.2 lb

## 2024-08-06 DIAGNOSIS — F1210 Cannabis abuse, uncomplicated: Secondary | ICD-10-CM | POA: Insufficient documentation

## 2024-08-06 DIAGNOSIS — N529 Male erectile dysfunction, unspecified: Secondary | ICD-10-CM | POA: Insufficient documentation

## 2024-08-06 DIAGNOSIS — G2581 Restless legs syndrome: Secondary | ICD-10-CM | POA: Diagnosis not present

## 2024-08-06 DIAGNOSIS — I10 Essential (primary) hypertension: Secondary | ICD-10-CM | POA: Diagnosis not present

## 2024-08-06 MED ORDER — ROPINIROLE HCL 0.5 MG PO TABS: 0.5000 mg | ORAL_TABLET | Freq: Every day | ORAL | 0 refills | Status: DC

## 2024-08-06 NOTE — Progress Notes (Signed)
 Subjective:  Patient ID: Adrian Edwards, male    DOB: [birth year], 68 y.o.   MRN: [id number]  Patient Care Team: Deitra Morton Sebastian Nena, NP as PCP - General (Nurse Practitioner)   Chief Complaint:  Medical Management of Chronic Issues (2 week)   HPI: Adrian Edwards is a 68 y.o. male presenting on 08/06/2024 for Medical Management of Chronic Issues (2 week)   Discussed the use of AI scribe software for clinical note transcription with the patient, who gave verbal consent to proceed.  History of Present Illness [name] is a 68 year old male with hypertension who presents for a two-week follow-up for blood pressure check.  His blood pressure was previously recorded at 178/89 mmHg, leading to an increase in his amlodipine  dosage to 10 mg daily. He continues to take amlodipine  10 mg daily.  He has a history of colitis and recently took prednisone  for four days, which improved his bowel movements. He is concerned that the effects of prednisone  might be wearing off. He also takes a 'blue pill' prescribed by his gastroenterologist for pain every morning and has an upcoming appointment with his gastroenterologist in January.  He experiences erectile dysfunction, noting that he can achieve an erection but it is not as firm or lasting as before. He associates the onset of these symptoms with changes in his blood pressure medication regimen about a year ago. He also mentions daily marijuana use, which he believes helps with inflammation.  He has a history of restless leg syndrome and previously took gabapentin for this condition, and would like to restart it.  In terms of social history, he smokes marijuana daily and has a history of cigarette smoking in the 1980s. He is divorced and his girlfriend stays with him three nights a week. He does not drive and relies on his sister for transportation.    Relevant past medical, surgical, family, and social history reviewed and updated as  indicated.  Allergies and medications reviewed and updated. Data reviewed: Chart in Epic.   Past Medical History:  Diagnosis Date   Anemia    B12 deficiency    Dermatitis    Hypertension    Ulcerative colitis (HCC)     History reviewed. No pertinent surgical history.  Social History   Socioeconomic History   Marital status: Divorced    Spouse name: Not on file   Number of children: Not on file   Years of education: Not on file   Highest education level: Not on file  Occupational History   Not on file  Tobacco Use   Smoking status: Every Day    Current packs/day: 0.50    Types: Cigarettes   Smokeless tobacco: Never  Substance and Sexual Activity   Alcohol use: No   Drug use: No   Sexual activity: Not on file  Other Topics Concern   Not on file  Social History Narrative   Not on file   Social Drivers of Health   Tobacco Use: High Risk (08/06/2024)   Patient History    Smoking Tobacco Use: Every Day    Smokeless Tobacco Use: Never    Passive Exposure: Not on file  Financial Resource Strain: High Risk (02/06/2024)   Received from [facility]   Overall Financial Resource Strain (CARDIA)    Difficulty of Paying Living Expenses: Hard  Food Insecurity: No Food Insecurity (07/21/2024)   Epic    Worried About Running Out of Food in the Last Year: Never true  Ran Out of Food in the Last Year: Never true  Transportation Needs: Unmet Transportation Needs (07/21/2024)   Epic    Lack of Transportation (Medical): Yes    Lack of Transportation (Non-Medical): Yes  Physical Activity: Not on file  Stress: Not on file  Social Connections: Not on file  Intimate Partner Violence: Not At Risk (07/21/2024)   Epic    Fear of Current or Ex-Partner: No    Emotionally Abused: No    Physically Abused: No    Sexually Abused: No  Depression (PHQ2-9): Medium Risk (08/06/2024)   Depression (PHQ2-9)    PHQ-2 Score: 7  Alcohol Screen: Low Risk (07/21/2024)   Alcohol Screen    Last  Alcohol Screening Score (AUDIT): 0  Housing: Low Risk (07/21/2024)   Epic    Unable to Pay for Housing in the Last Year: No    Number of Times Moved in the Last Year: 0    Homeless in the Last Year: No  Utilities: Not At Risk (07/21/2024)   Epic    Threatened with loss of utilities: No  Health Literacy: Not on file    Outpatient Encounter Medications as of 08/06/2024  Medication Sig   acetaminophen  (TYLENOL ) 500 MG tablet Take 1,000 mg by mouth every 6 (six) hours as needed. Pain   amLODipine  (NORVASC ) 10 MG tablet Take 1 tablet (10 mg total) by mouth daily.   cetirizine (ZYRTEC) 10 MG tablet Take 1 tablet by mouth daily as needed for allergies.   Cholecalciferol 1.25 MG (50000 UT) capsule Take 1 capsule every week by oral route.   dicyclomine (BENTYL) 20 MG tablet Take 20 mg by mouth every 6 (six) hours.   diphenoxylate-atropine (LOMOTIL) 2.5-0.025 MG tablet Take 1 tablet by mouth 4 (four) times daily as needed for diarrhea or loose stools.   fluticasone (FLONASE) 50 MCG/ACT nasal spray Spray 2 sprays every day by intranasal route.   hydrocortisone 2.5 % cream apply rectally twice daily   hydrOXYzine (ATARAX) 25 MG tablet Take 1 tablet by mouth in the morning and at bedtime.   omeprazole (PRILOSEC) 20 MG capsule Take 20 mg by mouth daily.   ondansetron  (ZOFRAN -ODT) 4 MG disintegrating tablet Take by mouth.   Simethicone 125 MG CAPS Take 1 capsule every day by oral route as needed.   sulfaSALAzine (AZULFIDINE) 500 MG tablet Take 1,000 mg by mouth 3 (three) times daily.   telmisartan (MICARDIS) 80 MG tablet Take 1 tablet by mouth daily.   triamcinolone cream (KENALOG) 0.1 % Apply 1 application topically 2 (two) times daily.   predniSONE  (DELTASONE ) 20 MG tablet 2 tabs po daily x 4 days (Patient not taking: Reported on 08/06/2024)   No facility-administered encounter medications on file as of 08/06/2024.    Allergies[1]  Pertinent ROS per HPI, otherwise unremarkable       Objective:  BP 125/69   Pulse 90   Temp 97.9 F (36.6 C) (Temporal)   Ht 5' 10 (1.778 m)   Wt 158 lb 3.2 oz (71.8 kg)   SpO2 96%   BMI 22.70 kg/m    Wt Readings from Last 3 Encounters:  08/06/24 158 lb 3.2 oz (71.8 kg)  07/21/24 158 lb 12.8 oz (72 kg)  10/10/21 170 lb (77.1 kg)    Physical Exam Vitals and nursing note reviewed.  Constitutional:      General: He is not in acute distress. HENT:     Head: Normocephalic and atraumatic.     Nose: Nose normal.  Mouth/Throat:     Mouth: Mucous membranes are moist.  Eyes:     Extraocular Movements: Extraocular movements intact.     Conjunctiva/sclera: Conjunctivae normal.     Pupils: Pupils are equal, round, and reactive to light.  Cardiovascular:     Heart sounds: Normal heart sounds.  Pulmonary:     Effort: Pulmonary effort is normal.     Breath sounds: Normal breath sounds.  Musculoskeletal:        General: Normal range of motion.     Right lower leg: No edema.     Left lower leg: No edema.  Skin:    General: Skin is warm and dry.     Findings: No rash.  Neurological:     Mental Status: He is alert and oriented to person, place, and time.  Psychiatric:        Mood and Affect: Mood normal.        Behavior: Behavior normal.        Thought Content: Thought content normal.    Physical Exam VITALS: BP- 125/69     Results for orders placed or performed in visit on 07/21/24  CBC with Differential/Platelet   Collection Time: 07/21/24  4:31 PM  Result Value Ref Range   WBC 7.5 3.4 - 10.8 x10E3/uL   RBC 4.33 4.14 - 5.80 x10E6/uL   Hemoglobin 14.7 13.0 - 17.7 g/dL   Hematocrit 56.6 62.4 - 51.0 %   MCV 100 (H) 79 - 97 fL   MCH 33.9 (H) 26.6 - 33.0 pg   MCHC 33.9 31.5 - 35.7 g/dL   RDW 86.6 88.3 - 84.5 %   Platelets 311 150 - 450 x10E3/uL   Neutrophils 68 Not Estab. %   Lymphs 14 Not Estab. %   Monocytes 16 Not Estab. %   Eos 1 Not Estab. %   Basos 1 Not Estab. %   Neutrophils Absolute 5.1 1.4 - 7.0  x10E3/uL   Lymphocytes Absolute 1.1 0.7 - 3.1 x10E3/uL   Monocytes Absolute 1.2 (H) 0.1 - 0.9 x10E3/uL   EOS (ABSOLUTE) 0.1 0.0 - 0.4 x10E3/uL   Basophils Absolute 0.1 0.0 - 0.2 x10E3/uL   Immature Granulocytes 0 Not Estab. %   Immature Grans (Abs) 0.0 0.0 - 0.1 x10E3/uL  CMP14+EGFR   Collection Time: 07/21/24  4:31 PM  Result Value Ref Range   Glucose 96 70 - 99 mg/dL   BUN 5 (L) 8 - 27 mg/dL   Creatinine, Ser 9.27 (L) 0.76 - 1.27 mg/dL   eGFR 899 >40 fO/fpw/8.26   BUN/Creatinine Ratio 7 (L) 10 - 24   Sodium 135 134 - 144 mmol/L   Potassium 3.7 3.5 - 5.2 mmol/L   Chloride 97 96 - 106 mmol/L   CO2 24 20 - 29 mmol/L   Calcium 9.1 8.6 - 10.2 mg/dL   Total Protein 6.9 6.0 - 8.5 g/dL   Albumin 4.4 3.9 - 4.9 g/dL   Globulin, Total 2.5 1.5 - 4.5 g/dL   Bilirubin Total 0.4 0.0 - 1.2 mg/dL   Alkaline Phosphatase 71 47 - 123 IU/L   AST 32 0 - 40 IU/L   ALT 23 0 - 44 IU/L  Hepatitis C antibody   Collection Time: 07/21/24  4:31 PM  Result Value Ref Range   Hep C Virus Ab Non Reactive Non Reactive  Thyroid  Panel With TSH   Collection Time: 07/21/24  4:31 PM  Result Value Ref Range   TSH 3.040 0.450 - 4.500 uIU/mL  T4, Total 5.9 4.5 - 12.0 ug/dL   T3 Uptake Ratio 30 24 - 39 %   Free Thyroxine Index 1.8 1.2 - 4.9  PSA, total and free   Collection Time: 07/21/24  4:31 PM  Result Value Ref Range   Prostate Specific Ag, Serum 0.8 0.0 - 4.0 ng/mL   PSA, Free 0.20 N/A ng/mL   PSA, Free Pct 25.0 %  Lipid panel   Collection Time: 07/21/24  4:31 PM  Result Value Ref Range   Cholesterol, Total 188 100 - 199 mg/dL   Triglycerides 97 0 - 149 mg/dL   HDL 93 >60 mg/dL   VLDL Cholesterol Cal 17 5 - 40 mg/dL   LDL Chol Calc (NIH) 78 0 - 99 mg/dL   Chol/HDL Ratio 2.0 0.0 - 5.0 ratio       Pertinent labs & imaging results that were available during my care of the patient were reviewed by me and considered in my medical decision making.  Assessment & Plan:  Tyjon was seen today for  medical management of chronic issues.  Diagnoses and all orders for this visit:  Essential hypertension  Cannabis abuse, daily use  Erectile dysfunction, unspecified erectile dysfunction type -     Testosterone,Free and Total     Assessment and Plan [place] is a 68 year old Caucasian male seen today for chronic disease management, no acute distress Assessment & Plan Essential hypertension Blood pressure controlled at 125/69 mmHg with current medication. - Continue amlodipine  10 mg daily.  Restless legs syndrome Symptoms consistent with restless legs syndrome, especially at night. Previous gabapentin use noted. - Prescribed Ropinirole  at bedtime.  Male erectile dysfunction Erectile dysfunction noted. Possible medication-related etiology. Testosterone level to be checked. - Ordered testosterone level test between 8-9 AM. - Instructed to schedule lab appointment for testosterone test.  Cannabis use disorder Daily marijuana use may affect testosterone levels and erectile dysfunction. He believes it helps with inflammation and pain. - Discussed impact of marijuana on testosterone levels and erectile dysfunction.      Continue all other maintenance medications.  Follow up plan: No follow-ups on file.   Continue healthy lifestyle choices, including diet (rich in fruits, vegetables, and lean proteins, and low in salt and simple carbohydrates) and exercise (at least 30 minutes of moderate physical activity daily).  Educational handout given for   Clinical References  Cannabinoid Hyperemesis Syndrome Cannabinoid hyperemesis syndrome (CHS) is a condition that causes repeated nausea, vomiting, and abdominal pain after long-term use of marijuana (cannabis). People with CHS typically use marijuana 3-5 times a day for many years before they have symptoms, although it is possible to develop CHS with far less daily use. Symptoms of CHS may be mild at first but can get worse and more  frequent. In some cases, CHS may cause severe daily vomiting, which can lead to weight loss and dehydration. What are the causes? The exact cause of CHS is not known. Long-term use of marijuana may overstimulate certain proteins in the brain and digestive tract that react with chemicals in marijuana (cannabinoid receptors). This overstimulation may cause CHS. What are the signs or symptoms? Symptoms of CHS are often mild during the first few episodes, but they can get worse over time. Symptoms may include: Frequent nausea, especially early in the morning. Vomiting. This can become severe. Abdominal pain. Feeling very tired (lethargic). Headaches. CHS may go away and come back many times (recur). People may not have symptoms or may otherwise be healthy in between [facility]  episodes. Taking hot showers can relieve the symptoms of CHS, so feeling the need to take several hot showers throughout the day can be a sign of this condition. How is this diagnosed? CHS may be diagnosed based on: Your symptoms and medical history, including any drug use. A physical exam. You may have tests done to rule out other problems that could cause your symptoms. These tests may include: Blood tests. Urine tests. Imaging tests, such as an X-ray or a CT scan. How is this treated? Treatment for this condition involves stopping marijuana use. Treatment may include: A drug rehab program, if you have trouble stopping marijuana use. Medicines for nausea. These may be given at the hospital through an IV inserted into one of your veins, or they may be medicines that you take by mouth (orally). Certain creams that contain a substance called capsaicin. These may improve symptoms when applied to the abdomen. Hot showers to help relieve symptoms. In severe cases, you may need treatment at a hospital. You may be given IV fluids to prevent or treat dehydration as well as medicines to treat nausea, vomiting, and pain. Follow these  instructions at home: During an episode of CHS  Stay in bed and rest in a dark, quiet room. Take anti-nausea medicine as told by your health care provider. Try taking hot showers to relieve your symptoms. After an episode of CHS Drink small amounts of clear fluids. Slowly add more if you can keep the fluids down without vomiting. Once you are able to eat without vomiting, eat soft foods in small amounts every 3-4 hours. General instructions Do not use any products that contain marijuana.If you need help quitting, ask your health care provider for resources and treatment options. Drink enough fluid to keep your urine pale yellow. Avoid drinking fluids that have a lot of sugar or caffeine, such as coffee and soda. Take and apply over-the-counter and prescription medicines only as told by your health care provider. Ask your health care provider before starting any new medicines or treatments. Keep all follow-up visits. This includes any recommended programs for substance use disorders. Contact a health care provider if: Your symptoms get worse. You cannot drink fluids without vomiting or severe pain. You have pain and trouble swallowing after an episode. Get help right away if: You cannot stop vomiting. You have blood in your vomit or your vomit looks like coffee grounds. You have severe abdominal pain. You have stools that are bloody or black, or stools that look like tar. You have symptoms of dehydration, such as: Sunken eyes. Inability to make tears. Cracked lips or dry mouth. Decreased urine production. Weakness. Sleepiness. Dizziness, light-headedness, or fainting. These symptoms may be an emergency. Get help right away. Call 911. Do not wait to see if the symptoms will go away. Do not drive yourself to the hospital. Summary Cannabinoid hyperemesis syndrome (CHS) is a condition that causes repeated nausea, vomiting, and abdominal pain after long-term use of marijuana. Treatment  for this condition involves stopping marijuana use. Hot showers and capsaicin creams may also help relieve symptoms. Your health care provider may prescribe medicines to help with nausea. Ask your health care provider before starting any medicines or other treatments. This information is not intended to replace advice given to you by your health care provider. Make sure you discuss any questions you have with your health care provider. Document Revised: 12/04/2021 Document Reviewed: 12/04/2021 Elsevier Patient Education  2024 Elsevier Inc. Substance Use Disorder Substance use disorder  occurs when a person's repeated use of drugs or alcohol interferes with the ability to be productive. This disorder can cause problems with mental and physical health. It can affect your ability to have healthy relationships, and it can keep you from being able to meet your responsibilities at work, home, or school. It can also lead to addiction, which is a condition in which you cannot stop using the substance consistently for a period of time. Addiction changes the way the brain works. Because of these changes, addiction is a chronic condition. Substance use disorder can be mild, moderate, or severe. Some commonly misused substances that can lead to this disorder include: Alcohol. Tobacco. Marijuana. Stimulants, such as cocaine and methamphetamine. Hallucinogens, such as LSD and PCP. Opioids, such as some prescription pain medicines and heroin. What are the causes? This condition may develop due to many complex social, psychological, or physical reasons, such as: Stress. Abuse. Peer pressure. Anxiety or depression. What increases the risk? This condition is more likely to develop in people who: Use substances to cope with stress. Have been abused. Have a mental health disorder, such as depression. Have a family history of substance use disorder. What are the signs or symptoms? Symptoms of this condition  include: Using the substance for longer periods of time or at a higher dosage than what is normal or intended. Having a lasting desire to use the substance. Being unable to slow down or stop the use of the substance. Spending an abnormal amount of time getting the substance, using the substance, or recovering from using the substance. Using the substance in a way that interferes with work, school, social activities, and personal relationships. Using the substance even after having negative consequences, such as: Health problems. Legal or financial troubles. Job loss. Relationship problems. Needing more and more of the substance to get the same effect (developing tolerance). Experiencing unpleasant symptoms if you do not use the substance (withdrawal). Using the substance to avoid withdrawal symptoms. How is this diagnosed? This condition may be diagnosed based on: A physical exam. Your history of substance use. Your symptoms. This includes: How substance use affects your life. Changes in personality, behaviors, and mood. Having at least two symptoms of substance use disorder within a 7-month period. Health issues related to substance use, such as liver damage, shortness of breath, fatigue, cough, or heart problems. Blood or urine tests to screen for alcohol and drugs. How is this treated? This condition may be treated by: Stopping substance use safely. This may require taking medicines and being closely monitored for several days. Taking part in group and individual counseling from mental health providers who help people with substance use disorder. Staying at a live-in (residential) treatment center for several days or weeks. Attending daily counseling sessions at a treatment center. Taking medicine as told by your health care provider: To ease symptoms and prevent complications during withdrawal. To treat other mental health issues, such as depression or anxiety. To block cravings  by causing the same effects as the substance. To block the effects of the substance or replace good sensations with unpleasant ones. Participating in a support group to share your experience with others who are going through the same thing. These groups are an important part of long-term recovery for many people. Recovery can be a long process. Many people who undergo treatment start using the substance again after stopping (relapse). If you relapse, that does not mean that treatment will not work. Follow these instructions at home:  Take over-the-counter and prescription medicines only as told by your health care provider. Do not use any drugs or alcohol. Avoid temptations or triggers that you associate with your use of the substance. Learn and practice techniques for managing stress. Have a plan for vulnerable moments. Get phone numbers of people who are willing to help and who are committed to your recovery. Attend support groups on a regular basis. These groups include 12-step programs like Alcoholics Anonymous and Narcotics Anonymous. Keep all follow-up visits. This is important. This includes continuing to work with therapists and support groups. Where to find more information Substance Abuse and Mental Health Services [facility]): skateoasis.com.pt National Alliance on Mental Illness (NAMI): www.nami.org Contact a health care provider if: You cannot take your medicines as told. Your symptoms get worse. You have trouble resisting the urge to use drugs or alcohol. Get help right away if: You relapse. You think that you may have taken too much of a drug. The [facility] hotline is (346)184-1780. You have signs of an overdose. Symptoms include: Chest pain. Confusion. Sleepiness or difficulty staying awake. Slowed breathing. Nausea or vomiting. A seizure. You have serious thoughts about hurting yourself or someone else. Drug overdose is an emergency. Do  not wait to see if the symptoms will go away. Get medical help right away. Call your local emergency services (911 in the [place] Do not drive yourself to the hospital. If you ever feel like you may hurt yourself or others, or have thoughts about taking your own life, get help right away. Go to your nearest emergency department or: Call your local emergency services (911 in the [place] Call a suicide crisis helpline, such as the National Suicide Prevention Lifeline at 940-505-9371 or 988 in the [place] This is open 24 hours a day in the [place] If youre a Veteran: Call 988 and press 1. This is open 24 hours a day. Text the [employer] at 951-418-1100. Summary Substance use disorder occurs when a person's repeated use of drugs or alcohol interferes with the ability to be productive. Taking part in group and individual counseling from mental health providers is a common treatment for people with substance use disorder. Recovery can be a long process. Many people who undergo treatment start using the substance again after stopping (relapse). A relapse does not mean that treatment will not work. Attend support groups such as Alcoholics Anonymous and Narcotics Anonymous. These groups are an important part of long-term recovery for many people. This information is not intended to replace advice given to you by your health care provider. Make sure you discuss any questions you have with your health care provider. Document Revised: 03/21/2023 Document Reviewed: 12/02/2020 Elsevier Patient Education  2024 Elsevier Inc. Erectile Dysfunction Erectile dysfunction (ED) is the inability to get or keep an erection in order to have sexual intercourse. ED is considered a symptom of an underlying disorder and is not considered a disease. ED may include: Inability to get an erection. Lack of enough hardness of the erection to allow penetration. Loss of erection before sex is finished. What are the causes? This  condition may be caused by: Physical causes, such as: Artery problems. This may include heart disease, high blood pressure, atherosclerosis, and diabetes. Hormonal problems, such as low testosterone. Obesity. Nerve problems. This may include back or pelvic injuries, multiple sclerosis, Parkinson's disease, spinal cord injury, and stroke. Certain medicines, such as: Pain relievers. Antidepressants. Blood pressure medicines and water pills (diuretics). Cancer medicines.  Antihistamines. Muscle relaxants. Lifestyle factors, such as: Use of drugs such as marijuana, cocaine, or opioids. Excessive use of alcohol. Smoking. Lack of physical activity or exercise. Psychological causes, such as: Anxiety or stress. Sadness or depression. Exhaustion. Fear about sexual performance. Guilt. What are the signs or symptoms? Symptoms of this condition include: Inability to get an erection. Lack of enough hardness of the erection to allow penetration. Loss of the erection before sex is finished. Sometimes having normal erections, but with frequent unsatisfactory episodes. Low sexual satisfaction in either partner due to erection problems. A curved penis occurring with erection. The curve may cause pain, or the penis may be too curved to allow for intercourse. Never having nighttime or morning erections. How is this diagnosed? This condition is often diagnosed by: Performing a physical exam to find other diseases or specific problems with the penis. Asking you detailed questions about the problem. Doing tests, such as: Blood tests to check for diabetes mellitus or high cholesterol, or to measure hormone levels. Other tests to check for underlying health conditions. An ultrasound exam to check for scarring. A test to check blood flow to the penis. Doing a sleep study at home to measure nighttime erections. How is this treated? This condition may be treated by: Medicines, such as: Medicine  taken by mouth to help you achieve an erection (oral medicine). Hormone replacement therapy to replace low testosterone levels. Medicine that is injected into the penis. Your health care provider may instruct you how to give yourself these injections at home. Medicine that is delivered with a short applicator tube. The tube is inserted into the opening at the tip of the penis, which is the opening of the urethra. A tiny pellet of medicine is put in the urethra. The pellet dissolves and enhances erectile function. This is also called MUSE (medicated urethral system for erections) therapy. Vacuum pump. This is a pump with a ring on it. The pump and ring are placed on the penis and used to create pressure that helps the penis become erect. Penile implant surgery. In this procedure, you may receive: An inflatable implant. This consists of cylinders, a pump, and a reservoir. The cylinders can be inflated with a fluid that helps to create an erection, and they can be deflated after intercourse. A semi-rigid implant. This consists of two silicone rubber rods. The rods provide some rigidity. They are also flexible, so the penis can both curve downward in its normal position and become straight for sexual intercourse. Blood vessel surgery to improve blood flow to the penis. During this procedure, a blood vessel from a different part of the body is placed into the penis to allow blood to flow around (bypass) damaged or blocked blood vessels. Lifestyle changes, such as exercising more, losing weight, and quitting smoking. Follow these instructions at home: Medicines  Take over-the-counter and prescription medicines only as told by your health care provider. Do not increase the dosage without first discussing it with your health care provider. If you are using self-injections, do injections as directed by your health care provider. Make sure you avoid any veins that are on the surface of the penis. After giving an  injection, apply pressure to the injection site for 5 minutes. Talk to your health care provider about how to prevent headaches while taking ED medicines. These medicines may cause a sudden headache due to the increase in blood flow in your body. General instructions Exercise regularly, as directed by your health care provider.  Work with your health care provider to lose weight, if needed. Do not use any products that contain nicotine or tobacco. These products include cigarettes, chewing tobacco, and vaping devices, such as e-cigarettes. If you need help quitting, ask your health care provider. Before using a vacuum pump, read the instructions that come with the pump and discuss any questions with your health care provider. Keep all follow-up visits. This is important. Contact a health care provider if: You feel nauseous. You are vomiting. You get sudden headaches while taking ED medicines. You have any concerns about your sexual health. Get help right away if: You are taking oral or injectable medicines and you have an erection that lasts longer than 4 hours. If your health care provider is unavailable, go to the nearest emergency room for evaluation. An erection that lasts much longer than 4 hours can result in permanent damage to your penis. You have severe pain in your groin or abdomen. You develop redness or severe swelling of your penis. You have redness spreading at your groin or lower abdomen. You are unable to urinate. You experience chest pain or a rapid heartbeat (palpitations) after taking oral medicines. These symptoms may represent a serious problem that is an emergency. Do not wait to see if the symptoms will go away. Get medical help right away. Call your local emergency services (911 in the [place] Do not drive yourself to the hospital. Summary Erectile dysfunction (ED) is the inability to get or keep an erection during sexual intercourse. This condition is diagnosed based on a  physical exam, your symptoms, and tests to determine the cause. Treatment varies depending on the cause and may include medicines, hormone therapy, surgery, or a vacuum pump. You may need follow-up visits to make sure that you are using your medicines or devices correctly. Get help right away if you are taking or injecting medicines and you have an erection that lasts longer than 4 hours. This information is not intended to replace advice given to you by your health care provider. Make sure you discuss any questions you have with your health care provider. Document Revised: 11/02/2020 Document Reviewed: 11/02/2020 Elsevier Patient Education  2025 [employer]. Hypertension, Adult High blood pressure (hypertension) is when the force of blood pumping through the arteries is too strong. The arteries are the blood vessels that carry blood from the heart throughout the body. Hypertension forces the heart to work harder to pump blood and may cause arteries to become narrow or stiff. Untreated or uncontrolled hypertension can lead to a heart attack, heart failure, a stroke, kidney disease, and other problems. A blood pressure reading consists of a higher number over a lower number. Ideally, your blood pressure should be below 120/80. The first (top) number is called the systolic pressure. It is a measure of the pressure in your arteries as your heart beats. The second (bottom) number is called the diastolic pressure. It is a measure of the pressure in your arteries as the heart relaxes. What are the causes? The exact cause of this condition is not known. There are some conditions that result in high blood pressure. What increases the risk? Certain factors may make you more likely to develop high blood pressure. Some of these risk factors are under your control, including: Smoking. Not getting enough exercise or physical activity. Being overweight. Having too much fat, sugar, calories, or salt (sodium)  in your diet. Drinking too much alcohol. Other risk factors include: Having a personal history of  heart disease, diabetes, high cholesterol, or kidney disease. Stress. Having a family history of high blood pressure and high cholesterol. Having obstructive sleep apnea. Age. The risk increases with age. What are the signs or symptoms? High blood pressure may not cause symptoms. Very high blood pressure (hypertensive crisis) may cause: Headache. Fast or irregular heartbeats (palpitations). Shortness of breath. Nosebleed. Nausea and vomiting. Vision changes. Severe chest pain, dizziness, and seizures. How is this diagnosed? This condition is diagnosed by measuring your blood pressure while you are seated, with your arm resting on a flat surface, your legs uncrossed, and your feet flat on the floor. The cuff of the blood pressure monitor will be placed directly against the skin of your upper arm at the level of your heart. Blood pressure should be measured at least twice using the same arm. Certain conditions can cause a difference in blood pressure between your right and left arms. If you have a high blood pressure reading during one visit or you have normal blood pressure with other risk factors, you may be asked to: Return on a different day to have your blood pressure checked again. Monitor your blood pressure at home for 1 week or longer. If you are diagnosed with hypertension, you may have other blood or imaging tests to help your health care provider understand your overall risk for other conditions. How is this treated? This condition is treated by making healthy lifestyle changes, such as eating healthy foods, exercising more, and reducing your alcohol intake. You may be referred for counseling on a healthy diet and physical activity. Your health care provider may prescribe medicine if lifestyle changes are not enough to get your blood pressure under control and if: Your systolic blood  pressure is above 130. Your diastolic blood pressure is above 80. Your personal target blood pressure may vary depending on your medical conditions, your age, and other factors. Follow these instructions at home: Eating and drinking  Eat a diet that is high in fiber and potassium, and low in sodium, added sugar, and fat. An example of this eating plan is called the DASH diet. DASH stands for Dietary Approaches to Stop Hypertension. To eat this way: Eat plenty of fresh fruits and vegetables. Try to fill one half of your plate at each meal with fruits and vegetables. Eat whole grains, such as whole-wheat pasta, brown rice, or whole-grain bread. Fill about one fourth of your plate with whole grains. Eat or drink low-fat dairy products, such as skim milk or low-fat yogurt. Avoid fatty cuts of meat, processed or cured meats, and poultry with skin. Fill about one fourth of your plate with lean proteins, such as fish, chicken without skin, beans, eggs, or tofu. Avoid pre-made and processed foods. These tend to be higher in sodium, added sugar, and fat. Reduce your daily sodium intake. Many people with hypertension should eat less than 1,500 mg of sodium a day. Do not drink alcohol if: Your health care provider tells you not to drink. You are pregnant, may be pregnant, or are planning to become pregnant. If you drink alcohol: Limit how much you have to: 0-1 drink a day for women. 0-2 drinks a day for men. Know how much alcohol is in your drink. In the [place] one drink equals one 12 oz bottle of beer (355 mL), one 5 oz glass of wine (148 mL), or one 1 oz glass of hard liquor (44 mL). Lifestyle  Work with your health care provider to maintain a  healthy body weight or to lose weight. Ask what an ideal weight is for you. Get at least 30 minutes of exercise that causes your heart to beat faster (aerobic exercise) most days of the week. Activities may include walking, swimming, or biking. Include exercise  to strengthen your muscles (resistance exercise), such as Pilates or lifting weights, as part of your weekly exercise routine. Try to do these types of exercises for 30 minutes at least 3 days a week. Do not use any products that contain nicotine or tobacco. These products include cigarettes, chewing tobacco, and vaping devices, such as e-cigarettes. If you need help quitting, ask your health care provider. Monitor your blood pressure at home as told by your health care provider. Keep all follow-up visits. This is important. Medicines Take over-the-counter and prescription medicines only as told by your health care provider. Follow directions carefully. Blood pressure medicines must be taken as prescribed. Do not skip doses of blood pressure medicine. Doing this puts you at risk for problems and can make the medicine less effective. Ask your health care provider about side effects or reactions to medicines that you should watch for. Contact a health care provider if you: Think you are having a reaction to a medicine you are taking. Have headaches that keep coming back (recurring). Feel dizzy. Have swelling in your ankles. Have trouble with your vision. Get help right away if you: Develop a severe headache or confusion. Have unusual weakness or numbness. Feel faint. Have severe pain in your chest or abdomen. Vomit repeatedly. Have trouble breathing. These symptoms may be an emergency. Get help right away. Call 911. Do not wait to see if the symptoms will go away. Do not drive yourself to the hospital. Summary Hypertension is when the force of blood pumping through your arteries is too strong. If this condition is not controlled, it may put you at risk for serious complications. Your personal target blood pressure may vary depending on your medical conditions, your age, and other factors. For most people, a normal blood pressure is less than 120/80. Hypertension is treated with lifestyle  changes, medicines, or a combination of both. Lifestyle changes include losing weight, eating a healthy, low-sodium diet, exercising more, and limiting alcohol. This information is not intended to replace advice given to you by your health care provider. Make sure you discuss any questions you have with your health care provider. Document Revised: 06/13/2021 Document Reviewed: 06/13/2021 Elsevier Patient Education  2024 [employer]  The above assessment and management plan was discussed with the patient. The patient verbalized understanding of and has agreed to the management plan. Patient is aware to call the clinic if they develop any new symptoms or if symptoms persist or worsen. Patient is aware when to return to the clinic for a follow-up visit. Patient educated on when it is appropriate to go to the emergency department.  Nena Cassis Morton Hummer, DNP [facility] [address] [place], [place] [address] [phone]      [1]  Allergies Allergen Reactions   Aspirin Other (See Comments)    Colon disturbance per patient   Ketorolac  Tromethamine  Itching

## 2024-08-24 ENCOUNTER — Other Ambulatory Visit: Payer: Self-pay | Admitting: Nurse Practitioner

## 2024-08-24 DIAGNOSIS — K5100 Ulcerative (chronic) pancolitis without complications: Secondary | ICD-10-CM

## 2024-08-24 DIAGNOSIS — G2581 Restless legs syndrome: Secondary | ICD-10-CM

## 2024-08-26 ENCOUNTER — Ambulatory Visit (INDEPENDENT_AMBULATORY_CARE_PROVIDER_SITE_OTHER): Admitting: Gastroenterology

## 2024-09-23 ENCOUNTER — Telehealth: Payer: Self-pay | Admitting: Nurse Practitioner

## 2024-09-23 NOTE — Telephone Encounter (Signed)
 Called and spoke with patients sister per signed DPR and made her aware that patient will run out of his Amlodipine  and Ropinirole  on 10/22/2024 so patient will need an appt. Sister made patient an appt on 2/24 with Oneil Severin to re-est care and to get meds refilled.

## 2024-09-23 NOTE — Telephone Encounter (Unsigned)
 Copied from CRM #[id number]. Topic: Clinical - Medication Question >> Sep 23, 2024  9:35 AM Alfonso ORN wrote: Reason for CRM: Patient :s Sister Tamika Nou calling asking questioning if patient medications due for refills ? Please call sister back today to let know

## 2024-09-29 ENCOUNTER — Ambulatory Visit

## 2024-10-01 ENCOUNTER — Ambulatory Visit (INDEPENDENT_AMBULATORY_CARE_PROVIDER_SITE_OTHER): Admitting: Gastroenterology

## 2024-10-13 ENCOUNTER — Ambulatory Visit: Admitting: Family Medicine

## 2024-11-24 ENCOUNTER — Ambulatory Visit
